# Patient Record
Sex: Male | Born: 1965 | Race: White | Hispanic: No | Marital: Married | Smoking: Former smoker
Health system: Southern US, Community
[De-identification: ages and names within clinical notes are randomized; demographics above are authoritative.]

## PROBLEM LIST (undated history)

## (undated) DIAGNOSIS — R519 Headache, unspecified: Secondary | ICD-10-CM

## (undated) DIAGNOSIS — G935 Compression of brain: Secondary | ICD-10-CM

## (undated) DIAGNOSIS — E781 Pure hyperglyceridemia: Secondary | ICD-10-CM

## (undated) DIAGNOSIS — G4730 Sleep apnea, unspecified: Secondary | ICD-10-CM

## (undated) DIAGNOSIS — N200 Calculus of kidney: Secondary | ICD-10-CM

## (undated) DIAGNOSIS — Z87442 Personal history of urinary calculi: Secondary | ICD-10-CM

## (undated) DIAGNOSIS — R51 Headache: Secondary | ICD-10-CM

## (undated) DIAGNOSIS — K5792 Diverticulitis of intestine, part unspecified, without perforation or abscess without bleeding: Secondary | ICD-10-CM

## (undated) HISTORY — PX: WISDOM TOOTH EXTRACTION: SHX21

## (undated) HISTORY — DX: Compression of brain: G93.5

---

## 2006-11-07 ENCOUNTER — Ambulatory Visit: Payer: Self-pay | Admitting: Gastroenterology

## 2006-11-11 ENCOUNTER — Ambulatory Visit: Payer: Self-pay | Admitting: Gastroenterology

## 2006-11-13 ENCOUNTER — Ambulatory Visit: Payer: Self-pay | Admitting: Gastroenterology

## 2006-11-19 ENCOUNTER — Ambulatory Visit: Payer: Self-pay | Admitting: Gastroenterology

## 2010-02-18 ENCOUNTER — Emergency Department: Payer: Self-pay | Admitting: Internal Medicine

## 2010-03-08 ENCOUNTER — Emergency Department: Payer: Self-pay | Admitting: Emergency Medicine

## 2010-03-08 ENCOUNTER — Ambulatory Visit: Payer: Self-pay | Admitting: Urology

## 2011-05-31 ENCOUNTER — Emergency Department: Payer: Self-pay | Admitting: Emergency Medicine

## 2011-07-04 ENCOUNTER — Ambulatory Visit: Payer: Self-pay | Admitting: Urology

## 2011-08-08 ENCOUNTER — Ambulatory Visit: Payer: Self-pay | Admitting: Urology

## 2011-10-24 ENCOUNTER — Ambulatory Visit: Payer: Self-pay | Admitting: Urology

## 2011-12-04 ENCOUNTER — Ambulatory Visit: Payer: Self-pay | Admitting: Internal Medicine

## 2011-12-27 ENCOUNTER — Ambulatory Visit: Payer: Self-pay | Admitting: Internal Medicine

## 2015-03-09 ENCOUNTER — Ambulatory Visit: Payer: BLUE CROSS/BLUE SHIELD | Attending: Internal Medicine

## 2015-03-09 DIAGNOSIS — G4700 Insomnia, unspecified: Secondary | ICD-10-CM | POA: Diagnosis present

## 2015-03-09 DIAGNOSIS — R0683 Snoring: Secondary | ICD-10-CM | POA: Diagnosis present

## 2015-03-09 DIAGNOSIS — G4733 Obstructive sleep apnea (adult) (pediatric): Secondary | ICD-10-CM | POA: Diagnosis not present

## 2015-07-04 ENCOUNTER — Ambulatory Visit
Admission: RE | Admit: 2015-07-04 | Discharge: 2015-07-04 | Disposition: A | Discharge status: Home / Self Care | Payer: BLUE CROSS/BLUE SHIELD | Source: Ambulatory Visit | Attending: Internal Medicine | Admitting: Internal Medicine

## 2015-07-04 ENCOUNTER — Other Ambulatory Visit: Payer: Self-pay | Admitting: Internal Medicine

## 2015-07-04 DIAGNOSIS — R1031 Right lower quadrant pain: Secondary | ICD-10-CM | POA: Diagnosis present

## 2015-07-04 DIAGNOSIS — N200 Calculus of kidney: Secondary | ICD-10-CM | POA: Insufficient documentation

## 2015-07-04 DIAGNOSIS — K760 Fatty (change of) liver, not elsewhere classified: Secondary | ICD-10-CM | POA: Diagnosis not present

## 2015-07-04 DIAGNOSIS — K5730 Diverticulosis of large intestine without perforation or abscess without bleeding: Secondary | ICD-10-CM | POA: Insufficient documentation

## 2015-07-04 DIAGNOSIS — G8929 Other chronic pain: Secondary | ICD-10-CM | POA: Diagnosis present

## 2015-07-04 DIAGNOSIS — R102 Pelvic and perineal pain: Secondary | ICD-10-CM | POA: Insufficient documentation

## 2015-09-07 ENCOUNTER — Emergency Department
Admission: EM | Admit: 2015-09-07 | Discharge: 2015-09-07 | Disposition: A | Discharge status: Home / Self Care | Payer: BLUE CROSS/BLUE SHIELD | Attending: Emergency Medicine | Admitting: Emergency Medicine

## 2015-09-07 ENCOUNTER — Emergency Department: Payer: BLUE CROSS/BLUE SHIELD

## 2015-09-07 DIAGNOSIS — R1033 Periumbilical pain: Secondary | ICD-10-CM | POA: Diagnosis present

## 2015-09-07 DIAGNOSIS — K5792 Diverticulitis of intestine, part unspecified, without perforation or abscess without bleeding: Secondary | ICD-10-CM | POA: Diagnosis not present

## 2015-09-07 HISTORY — DX: Diverticulitis of intestine, part unspecified, without perforation or abscess without bleeding: K57.92

## 2015-09-07 HISTORY — DX: Calculus of kidney: N20.0

## 2015-09-07 LAB — URINALYSIS COMPLETE WITH MICROSCOPIC (ARMC ONLY)
Bacteria, UA: NONE SEEN
Bilirubin Urine: NEGATIVE
Glucose, UA: NEGATIVE mg/dL
Hgb urine dipstick: NEGATIVE
KETONES UR: NEGATIVE mg/dL
Leukocytes, UA: NEGATIVE
Nitrite: NEGATIVE
PROTEIN: NEGATIVE mg/dL
SQUAMOUS EPITHELIAL / LPF: NONE SEEN
Specific Gravity, Urine: 1.034 — ABNORMAL HIGH (ref 1.005–1.030)
pH: 5 (ref 5.0–8.0)

## 2015-09-07 LAB — CBC
HCT: 42.6 % (ref 40.0–52.0)
HEMOGLOBIN: 14.5 g/dL (ref 13.0–18.0)
MCH: 32.3 pg (ref 26.0–34.0)
MCHC: 34 g/dL (ref 32.0–36.0)
MCV: 94.8 fL (ref 80.0–100.0)
PLATELETS: 199 10*3/uL (ref 150–440)
RBC: 4.49 MIL/uL (ref 4.40–5.90)
RDW: 12.5 % (ref 11.5–14.5)
WBC: 12 10*3/uL — ABNORMAL HIGH (ref 3.8–10.6)

## 2015-09-07 LAB — COMPREHENSIVE METABOLIC PANEL
ALBUMIN: 4.1 g/dL (ref 3.5–5.0)
ALK PHOS: 80 U/L (ref 38–126)
ALT: 75 U/L — AB (ref 17–63)
AST: 38 U/L (ref 15–41)
Anion gap: 5 (ref 5–15)
BUN: 21 mg/dL — ABNORMAL HIGH (ref 6–20)
CALCIUM: 8.9 mg/dL (ref 8.9–10.3)
CO2: 26 mmol/L (ref 22–32)
CREATININE: 0.82 mg/dL (ref 0.61–1.24)
Chloride: 105 mmol/L (ref 101–111)
GFR calc Af Amer: >60 mL/min (ref >60)
GFR calc non Af Amer: >60 mL/min (ref >60)
GLUCOSE: 173 mg/dL — AB (ref 65–99)
Potassium: 3.8 mmol/L (ref 3.5–5.1)
SODIUM: 136 mmol/L (ref 135–145)
Total Bilirubin: 0.8 mg/dL (ref 0.3–1.2)
Total Protein: 7.1 g/dL (ref 6.5–8.1)

## 2015-09-07 LAB — LIPASE, BLOOD: Lipase: 31 U/L (ref 11–51)

## 2015-09-07 MED ORDER — METRONIDAZOLE 500 MG PO TABS: 500.0000 mg | ORAL_TABLET | Freq: Three times a day (TID) | ORAL | Status: AC

## 2015-09-07 MED ORDER — IOHEXOL 240 MG/ML SOLN
25.0000 mL | Freq: Once | INTRAMUSCULAR | Status: AC | PRN
  Administered 2015-09-07: 25 mL via ORAL
  Filled 2015-09-07: qty 25

## 2015-09-07 MED ORDER — CIPROFLOXACIN HCL 500 MG PO TABS: 500.0000 mg | ORAL_TABLET | Freq: Two times a day (BID) | ORAL | Status: AC

## 2015-09-07 MED ORDER — IOHEXOL 300 MG/ML  SOLN
100.0000 mL | Freq: Once | INTRAMUSCULAR | Status: AC | PRN
  Administered 2015-09-07: 100 mL via INTRAVENOUS
  Filled 2015-09-07: qty 100

## 2015-09-07 MED ORDER — ONDANSETRON HCL 4 MG PO TABS: ORAL_TABLET | ORAL | Status: DC

## 2015-09-07 MED ORDER — DOCUSATE SODIUM 100 MG PO CAPS: ORAL_CAPSULE | ORAL | Status: DC

## 2015-09-07 MED ORDER — HYDROCODONE-ACETAMINOPHEN 5-325 MG PO TABS: 1.0000 | ORAL_TABLET | ORAL | Status: DC | PRN

## 2015-09-07 NOTE — ED Notes (Signed)
MD at bedside. 

## 2015-09-07 NOTE — ED Notes (Signed)
Pt alert and oriented X4, active, cooperative, pt in NAD. RR even and unlabored, color WNL.  Pt informed to return if any life threatening symptoms occur.   

## 2015-09-07 NOTE — ED Notes (Signed)
Patient transported to CT 

## 2015-09-07 NOTE — ED Provider Notes (Signed)
[facility] Emergency Department Provider Note  ____________________________________________  Time seen: Approximately 4:01 PM  I have reviewed the triage vital signs and the nursing notes.   HISTORY  Chief Complaint Abdominal Pain    HPI Maxwell Smith is a 49 y.o. male who presents with left-sided periumbilical abdominal pain that started this morning and his been gradually getting worse over the course of the day.  He has had some nausea but no vomiting.  He states that he has been having similar pain over the last several months and has been "bouncing back and forth" between his primary care doctor and his urologist.  He had a noncontrast CT scan about 2 months ago that showed diverticulosis and nephrolithiasis but without any obstructive stones.  Though he has had intermittent pain for several months, the pain is more localized to the left lower quadrant today and has been more persistent.  He rates the pain as severe with movement but mild at rest.  He denies fever/chills, chest pain, shortness of breath.  He also denies dysuria, testicular pain, and hematuria at this time.   Past Medical History  Diagnosis Date  . Kidney stone   . Diverticulitis     There are no active problems to display for this patient.   History reviewed. No pertinent past surgical history.  Current Outpatient Rx  Name  Route  Sig  Dispense  Refill  . ciprofloxacin (CIPRO) 500 MG tablet   Oral   Take 1 tablet (500 mg total) by mouth 2 (two) times daily.   14 tablet   0   . docusate sodium (COLACE) 100 MG capsule      Take 1 tablet once or twice daily as needed for constipation while taking narcotic pain medicine   30 capsule   0   . HYDROcodone-acetaminophen (NORCO/VICODIN) 5-325 MG tablet   Oral   Take 1-2 tablets by mouth every 4 (four) hours as needed for moderate pain.   15 tablet   0   . metroNIDAZOLE (FLAGYL) 500 MG tablet   Oral   Take 1 tablet (500 mg  total) by mouth 3 (three) times daily.   21 tablet   0   . ondansetron (ZOFRAN) 4 MG tablet      Take 1-2 tabs by mouth every 8 hours as needed for nausea/vomiting   30 tablet   0     Allergies Tricor  No family history on file.  Social History Social History  Substance Use Topics  . Smoking status: Never Smoker   . Smokeless tobacco: None  . Alcohol Use: Yes    Review of Systems Constitutional: No fever/chills Eyes: No visual changes. ENT: No sore throat. Cardiovascular: Denies chest pain. Respiratory: Denies shortness of breath. Gastrointestinal: LLQ abd pain w/ nausea Genitourinary: Negative for dysuria. Musculoskeletal: Negative for back pain. Skin: Negative for rash. Neurological: Negative for headaches, focal weakness or numbness.  10-point ROS otherwise negative.  ____________________________________________   PHYSICAL EXAM:  VITAL SIGNS: ED Triage Vitals  Enc Vitals Group     BP 09/07/15 1502 155/85 mmHg     Pulse Rate 09/07/15 1502 70     Resp 09/07/15 1502 20     Temp 09/07/15 1502 97.6 F (36.4 C)     Temp Source 09/07/15 1502 Oral     SpO2 09/07/15 1502 98 %     Weight 09/07/15 1502 232 lb (105.235 kg)     Height 09/07/15 1502  (1.803 m)  Head Cir --      Peak Flow --      Pain Score 09/07/15 1507 4     Pain Loc --      Pain Edu? --      Excl. in GC? --     Constitutional: Alert and oriented. Well appearing and in no acute distress but appears uncomfortable Eyes: Conjunctivae are normal. PERRL. EOMI. Head: Atraumatic. Nose: No congestion/rhinnorhea. Mouth/Throat: Mucous membranes are moist.  Oropharynx non-erythematous. Neck: No stridor.   Cardiovascular: Normal rate, regular rhythm. Grossly normal heart sounds.  Good peripheral circulation. Respiratory: Normal respiratory effort.  No retractions. Lungs CTAB. Gastrointestinal: Soft w/ TTP of LLQ and suprapubic region.  No peritonitis.  No abd bruits or pulsatile  masses Musculoskeletal: No lower extremity tenderness nor edema.  No joint effusions. Neurologic:  Normal speech and language. No gross focal neurologic deficits are appreciated.  Skin:  Skin is warm, dry and intact. No rash noted. Psychiatric: Mood and affect are normal. Speech and behavior are normal.  ____________________________________________   LABS (all labs ordered are listed, but only abnormal results are displayed)  Labs Reviewed  COMPREHENSIVE METABOLIC PANEL - Abnormal; Notable for the following:    Glucose, Bld 173 (*)    BUN 21 (*)    ALT 75 (*)    All other components within normal limits  CBC - Abnormal; Notable for the following:    WBC 12.0 (*)    All other components within normal limits  URINALYSIS COMPLETEWITH MICROSCOPIC ([facility] ONLY) - Abnormal; Notable for the following:    Color, Urine YELLOW (*)    APPearance CLEAR (*)    Specific Gravity, Urine 1.034 (*)    All other components within normal limits  LIPASE, BLOOD   ____________________________________________  EKG  ED ECG REPORT I, Tashon Capp, the attending physician, personally viewed and interpreted this ECG.  Date: 09/07/2015 EKG Time: 15:08 Rate: 56 Rhythm: Sinus bradycardia QRS Axis: normal Intervals: normal ST/T Wave abnormalities: normal Conduction Disutrbances: none Narrative Interpretation: unremarkable  ____________________________________________  RADIOLOGY   Ct Abdomen Pelvis W Contrast  09/07/2015  CLINICAL DATA:  Left-sided periumbilical pain, onset this morning, progressive worsening. Nausea. Pain radiates to the back. EXAM: CT ABDOMEN AND PELVIS WITH CONTRAST TECHNIQUE: Multidetector CT imaging of the abdomen and pelvis was performed using the standard protocol following bolus administration of intravenous contrast. CONTRAST:  100mL OMNIPAQUE IOHEXOL 300 MG/ML  SOLN COMPARISON:  CT 07/04/2015 FINDINGS: Lower chest: Minimal linear scarring in the left lower lobe. No  consolidation. Heart is normal in size. Liver: Diffusely decreased density consistent with steatosis. Focal fatty sparing is seen in the region the porta hepatis and gallbladder fossa. There is a subcentimeter hypodensity within the right lobe that is unchanged from prior exam. Hepatobiliary: Gallbladder physiologically distended, no calcified stone. No biliary dilatation. Pancreas: Normal. Spleen: Normal in size and density.  Small cleft posteriorly. Adrenal glands: No nodule. Kidneys: Symmetric renal enhancement and excretion. Bilateral nonobstructing nephrolithiasis, with largest stone in the lower left kidney measuring 6 mm. No hydronephrosis. No perinephric stranding. Ureters are decompressed without stones along the course. 2.8 cm simple cyst in the anterior lower left kidney with additional smaller cortical cysts, unchanged. Stomach/Bowel: Stomach is decompressed. There are no dilated or thickened small bowel loops. There is distal colonic diverticulosis with tortuosity of sigmoid colon. Equivocal wall thickening and mild adjacent fat stranding involving the sigmoid colon in the mid pelvis, may reflect minimal acute diverticulitis. No perforation, abscess or free air.  The appendix is normal. Vascular/Lymphatic: No retroperitoneal adenopathy. Abdominal aorta is normal in caliber. Mild atherosclerosis of the abdominal aorta without aneurysm. Reproductive: Prostate gland normal in size. Bladder: Physiologically distended, no wall thickening. Other: No free air, free fluid, or intra-abdominal fluid collection. Fat within the left inguinal canal. Musculoskeletal: There are no acute or suspicious osseous abnormalities. Degenerative disc disease at L5-S1. IMPRESSION: 1. Mild acute uncomplicated diverticulitis in the sigmoid colon, no perforation or abscess. 2. Chronic incidental findings include bilateral nephrolithiasis and hepatic steatosis. Electronically Signed   By: Rubye Oaks M.D.   On: 09/07/2015 18:19     ____________________________________________   PROCEDURES  Procedure(s) performed: None  Critical Care performed: No ____________________________________________   INITIAL IMPRESSION / ASSESSMENT AND PLAN / ED COURSE  Pertinent labs & imaging results that were available during my care of the patient were reviewed by me and considered in my medical decision making (see chart for details).  The patient did have a noncontrast CT scan 2 months ago, the acute onset of his discomfort today may represent a diverticulitis.  Alternatively, he may have passed or been in the process of passing a small stone from the larger nephrolithiasis identified previously.  Given his discomfort and tenderness I will evaluate with a contrasted CT scan.  He states at this time he does not want any pain medicine, but I offered to give his pain gets worse that we will treat it.  Aortic pathology is possible but less likely, and we will be able to see the caliber of his aorta on the CT scan as well.  ----------------------------------------- 6:49 PM on 09/07/2015 -----------------------------------------  Uncomplicated diverticulitis on CT.  I had an extensive discussion with the patient and his wife about this diagnosis in my usual customary recommendations, precautions, and follow-up recommendations.  The patient understands and agrees.  ____________________________________________  FINAL CLINICAL IMPRESSION(S) / ED DIAGNOSES  Final diagnoses:  Diverticulitis of intestine without perforation or abscess without bleeding      NEW MEDICATIONS STARTED DURING THIS VISIT:  New Prescriptions   CIPROFLOXACIN (CIPRO) 500 MG TABLET    Take 1 tablet (500 mg total) by mouth 2 (two) times daily.   DOCUSATE SODIUM (COLACE) 100 MG CAPSULE    Take 1 tablet once or twice daily as needed for constipation while taking narcotic pain medicine   HYDROCODONE-ACETAMINOPHEN (NORCO/VICODIN) 5-325 MG TABLET    Take 1-2  tablets by mouth every 4 (four) hours as needed for moderate pain.   METRONIDAZOLE (FLAGYL) 500 MG TABLET    Take 1 tablet (500 mg total) by mouth 3 (three) times daily.   ONDANSETRON (ZOFRAN) 4 MG TABLET    Take 1-2 tabs by mouth every 8 hours as needed for nausea/vomiting     Loleta Rose, MD 09/07/15 1850

## 2015-09-07 NOTE — ED Notes (Signed)
Ct staff at bedside to instruct pt on CT contrast

## 2015-09-07 NOTE — Discharge Instructions (Signed)
We believe your symptoms are caused by diverticulitis.  Most of the time this condition (please read through the included information) can be cured with outpatient antibiotics.  Please take the full course of prescribed medication(s) and follow up with the doctors recommended above.  Return to the ED if your abdominal pain worsens or fails to improve, you develop bloody vomiting, bloody diarrhea, you are unable to tolerate fluids due to vomiting, fever greater than 101, or other symptoms that concern you.  Take Norco as prescribed for severe pain. Do not drink alcohol, drive or participate in any other potentially dangerous activities while taking this medication as it may make you sleepy. Do not take this medication with any other sedating medications, either prescription or over-the-counter. If you were prescribed Percocet or Vicodin, do not take these with acetaminophen (Tylenol) as it is already contained within these medications.   This medication is an opiate (or narcotic) pain medication and can be habit forming.  Use it as little as possible to achieve adequate pain control.  Do not use or use it with extreme caution if you have a history of opiate abuse or dependence.  If you are on a pain contract with your primary care doctor or a pain specialist, be sure to let them know you were prescribed this medication today from the [facility] Emergency Department.  This medication is intended for your use only - do not give any to anyone else and keep it in a secure place where nobody else, especially children, have access to it.  It will also cause or worsen constipation, so you may want to consider taking an over-the-counter stool softener while you are taking this medication.   Diverticulitis Diverticulitis is inflammation or infection of small pouches in your colon that form when you have a condition called diverticulosis. The pouches in your colon are called diverticula. Your colon, or large  intestine, is where water is absorbed and stool is formed. Complications of diverticulitis can include:  Bleeding.  Severe infection.  Severe pain.  Perforation of your colon.  Obstruction of your colon. CAUSES  Diverticulitis is caused by bacteria. Diverticulitis happens when stool becomes trapped in diverticula. This allows bacteria to grow in the diverticula, which can lead to inflammation and infection. RISK FACTORS People with diverticulosis are at risk for diverticulitis. Eating a diet that does not include enough fiber from fruits and vegetables may make diverticulitis more likely to develop. SYMPTOMS  Symptoms of diverticulitis may include:  Abdominal pain and tenderness. The pain is normally located on the left side of the abdomen, but may occur in other areas.  Fever and chills.  Bloating.  Cramping.  Nausea.  Vomiting.  Constipation.  Diarrhea.  Blood in your stool. DIAGNOSIS  Your health care provider will ask you about your medical history and do a physical exam. You may need to have tests done because many medical conditions can cause the same symptoms as diverticulitis. Tests may include:  Blood tests.  Urine tests.  Imaging tests of the abdomen, including X-rays and CT scans. When your condition is under control, your health care provider may recommend that you have a colonoscopy. A colonoscopy can show how severe your diverticula are and whether something else is causing your symptoms. TREATMENT  Most cases of diverticulitis are mild and can be treated at home. Treatment may include:  Taking over-the-counter pain medicines.  Following a clear liquid diet.  Taking antibiotic medicines by mouth for 7-10 days. More severe  cases may be treated at a hospital. Treatment may include: °· Not eating or drinking. °· Taking prescription pain medicine. °· Receiving antibiotic medicines through an IV tube. °· Receiving fluids and nutrition through an IV  tube. °· Surgery. °HOME CARE INSTRUCTIONS  °· Follow your health care provider's instructions carefully. °· Follow a full liquid diet or other diet as directed by your health care provider. After your symptoms improve, your health care provider may tell you to change your diet. He or she may recommend you eat a high-fiber diet. Fruits and vegetables are good sources of fiber. Fiber makes it easier to pass stool. °· Take fiber supplements or probiotics as directed by your health care provider. °· Only take medicines as directed by your health care provider. °· Keep all your follow-up appointments. °SEEK MEDICAL CARE IF:  °· Your pain does not improve. °· You have a hard time eating food. °· Your bowel movements do not return to normal. °SEEK IMMEDIATE MEDICAL CARE IF:  °· Your pain becomes worse. °· Your symptoms do not get better. °· Your symptoms suddenly get worse. °· You have a fever. °· You have repeated vomiting. °· You have bloody or black, tarry stools. °MAKE SURE YOU:  °· Understand these instructions. °· Will watch your condition. °· Will get help right away if you are not doing well or get worse. °Document Released: 07/24/2005 Document Revised: 10/19/2013 Document Reviewed: 09/08/2013 °[employer]® Patient Information ©2015 [employer]. This information is not intended to replace advice given to you by your health care provider. Make sure you discuss any questions you have with your health care provider. ° °

## 2015-09-07 NOTE — ED Notes (Signed)
Left sided umbilical level pain that began this AM, worsening approx 12PM today. Nausea. Pain radiates to lower back. Denies urinary sx. Pt alert and oriented X4, active, cooperative, pt in NAD. RR even and unlabored, color WNL.

## 2015-11-17 ENCOUNTER — Encounter: Payer: Self-pay | Admitting: *Deleted

## 2015-11-20 ENCOUNTER — Ambulatory Visit: Payer: BLUE CROSS/BLUE SHIELD

## 2015-11-20 ENCOUNTER — Ambulatory Visit: Payer: BLUE CROSS/BLUE SHIELD | Admitting: Anesthesiology

## 2015-11-20 ENCOUNTER — Ambulatory Visit
Admission: RE | Admit: 2015-11-20 | Discharge: 2015-11-20 | Disposition: A | Discharge status: Home / Self Care | Payer: BLUE CROSS/BLUE SHIELD | Source: Ambulatory Visit | Attending: Gastroenterology | Admitting: Gastroenterology

## 2015-11-20 ENCOUNTER — Encounter: Payer: Self-pay | Admitting: Anesthesiology

## 2015-11-20 ENCOUNTER — Encounter
Admission: RE | Disposition: A | Discharge status: Home / Self Care | Payer: Self-pay | Source: Ambulatory Visit | Attending: Gastroenterology

## 2015-11-20 DIAGNOSIS — K921 Melena: Secondary | ICD-10-CM | POA: Diagnosis not present

## 2015-11-20 DIAGNOSIS — K2210 Ulcer of esophagus without bleeding: Secondary | ICD-10-CM | POA: Diagnosis not present

## 2015-11-20 DIAGNOSIS — K5730 Diverticulosis of large intestine without perforation or abscess without bleeding: Secondary | ICD-10-CM | POA: Diagnosis not present

## 2015-11-20 DIAGNOSIS — R1013 Epigastric pain: Secondary | ICD-10-CM | POA: Diagnosis not present

## 2015-11-20 DIAGNOSIS — Z79899 Other long term (current) drug therapy: Secondary | ICD-10-CM | POA: Insufficient documentation

## 2015-11-20 DIAGNOSIS — K210 Gastro-esophageal reflux disease with esophagitis: Secondary | ICD-10-CM | POA: Diagnosis not present

## 2015-11-20 DIAGNOSIS — Q438 Other specified congenital malformations of intestine: Secondary | ICD-10-CM | POA: Diagnosis not present

## 2015-11-20 DIAGNOSIS — K2950 Unspecified chronic gastritis without bleeding: Secondary | ICD-10-CM | POA: Insufficient documentation

## 2015-11-20 DIAGNOSIS — E781 Pure hyperglyceridemia: Secondary | ICD-10-CM | POA: Insufficient documentation

## 2015-11-20 DIAGNOSIS — Z7951 Long term (current) use of inhaled steroids: Secondary | ICD-10-CM | POA: Diagnosis not present

## 2015-11-20 DIAGNOSIS — Z888 Allergy status to other drugs, medicaments and biological substances status: Secondary | ICD-10-CM | POA: Diagnosis not present

## 2015-11-20 DIAGNOSIS — G4730 Sleep apnea, unspecified: Secondary | ICD-10-CM | POA: Insufficient documentation

## 2015-11-20 DIAGNOSIS — R1032 Left lower quadrant pain: Secondary | ICD-10-CM | POA: Insufficient documentation

## 2015-11-20 DIAGNOSIS — Z9889 Other specified postprocedural states: Secondary | ICD-10-CM

## 2015-11-20 HISTORY — DX: Headache: R51

## 2015-11-20 HISTORY — PX: COLONOSCOPY WITH PROPOFOL: SHX5780

## 2015-11-20 HISTORY — DX: Headache, unspecified: R51.9

## 2015-11-20 HISTORY — DX: Sleep apnea, unspecified: G47.30

## 2015-11-20 HISTORY — DX: Pure hyperglyceridemia: E78.1

## 2015-11-20 HISTORY — PX: ESOPHAGOGASTRODUODENOSCOPY (EGD) WITH PROPOFOL: SHX5813

## 2015-11-20 SURGERY — COLONOSCOPY WITH PROPOFOL
Anesthesia: General

## 2015-11-20 MED ORDER — FENTANYL CITRATE (PF) 100 MCG/2ML IJ SOLN
INTRAMUSCULAR | Status: DC | PRN
  Administered 2015-11-20 (×2): 50 ug via INTRAVENOUS

## 2015-11-20 MED ORDER — LIDOCAINE HCL (PF) 1 % IJ SOLN
INTRAMUSCULAR | Status: AC
  Administered 2015-11-20: 0.3 mL via INTRADERMAL
  Filled 2015-11-20: qty 2

## 2015-11-20 MED ORDER — PROPOFOL 500 MG/50ML IV EMUL
INTRAVENOUS | Status: DC | PRN
  Administered 2015-11-20: 180 ug/kg/min via INTRAVENOUS

## 2015-11-20 MED ORDER — MIDAZOLAM HCL 5 MG/5ML IJ SOLN
INTRAMUSCULAR | Status: DC | PRN
  Administered 2015-11-20: 2 mg via INTRAVENOUS

## 2015-11-20 MED ORDER — LIDOCAINE HCL (PF) 1 % IJ SOLN
2.0000 mL | Freq: Once | INTRAMUSCULAR | Status: AC
  Administered 2015-11-20: 0.3 mL via INTRADERMAL

## 2015-11-20 MED ORDER — SODIUM CHLORIDE 0.9 % IV SOLN
INTRAVENOUS | Status: DC
  Administered 2015-11-20: 1000 mL via INTRAVENOUS

## 2015-11-20 MED ORDER — PROPOFOL 10 MG/ML IV BOLUS
INTRAVENOUS | Status: DC | PRN
  Administered 2015-11-20: 20 mg via INTRAVENOUS
  Administered 2015-11-20: 50 mg via INTRAVENOUS
  Administered 2015-11-20: 20 mg via INTRAVENOUS

## 2015-11-20 NOTE — H&P (Signed)
Outpatient short stay form Pre-procedure 11/20/2015 2:25 PM Maxwell Deem MD  Primary Physician: Dr. Einar Crow  Reason for visit:  EGD and colonoscopy  History of present illness:  Patient is a 50 year old male presenting today the recent history of diverticulitis. He has a known history of diverticulosis. He did go to the emergency room and had a CT scan done on 09/07/2015 showing some active diverticulitis. He was treated with 2 weeks of antibiotics and has not had any repeat symptoms of that nature. However he also states he had seen some black stools in the past couple of months as well as having problems with dyspepsia. There has been some continued left lower quadrant discomfort though not like It was and some occasional right upper quadrant discomfort.  He tolerated his prep well. He takes no aspirin or blood thinning products.      Current facility-administered medications:  .  0.9 %  sodium chloride infusion, , Intravenous, Continuous, Maxwell Deem, MD, Last Rate: 20 mL/hr at 11/20/15 1331, 1,000 mL at 11/20/15 1331  Prescriptions prior to admission  Medication Sig Dispense Refill Last Dose  . fluticasone (FLONASE) 50 MCG/ACT nasal spray Place 2 sprays into both nostrils daily.     Marland Kitchen ibuprofen (ADVIL,MOTRIN) 100 MG tablet Take 100 mg by mouth every 6 (six) hours as needed for fever.     . traMADol (ULTRAM) 50 MG tablet Take by mouth every 6 (six) hours as needed.     . docusate sodium (COLACE) 100 MG capsule Take 1 tablet once or twice daily as needed for constipation while taking narcotic pain medicine 30 capsule 0   . HYDROcodone-acetaminophen (NORCO/VICODIN) 5-325 MG tablet Take 1-2 tablets by mouth every 4 (four) hours as needed for moderate pain. 15 tablet 0   . ondansetron (ZOFRAN) 4 MG tablet Take 1-2 tabs by mouth every 8 hours as needed for nausea/vomiting 30 tablet 0      Allergies  Allergen Reactions  . Tricor [Fenofibrate]      Past Medical  History  Diagnosis Date  . Kidney stone   . Diverticulitis   . Headache   . Sleep apnea   . Hypertriglyceridemia     Review of systems:      Physical Exam    Heart and lungs: Regular rate and rhythm without rub or gallop, lungs are bilaterally clear.    HEENT: Normocephalic atraumatic eyes are anicteric    Other:     Pertinant exam for procedure: Soft, protuberant, mild discomfort palpation left lower quadrant. No masses rebound or organomegaly. Bowel sounds positive normoactive.    Planned proceedures: EGD and colonoscopy with indicated procedures. I have discussed the risks benefits and complications of procedures to include not limited to bleeding, infection, perforation and the risk of sedation and the patient wishes to proceed.    Maxwell Deem, MD Gastroenterology 11/20/2015  2:25 PM

## 2015-11-20 NOTE — Op Note (Signed)
[facility] Gastroenterology Patient Name: Maxwell Smith Procedure Date: 11/20/2015 2:26 PM MRN: [id number] Account #: [id number] Date of Birth: 12-14-1965 Admit Type: Outpatient Age: 50 Room: [facility] ENDO ROOM 3 Gender: Male Note Status: Finalized Procedure:         Colonoscopy Indications:       Abdominal pain in the left lower quadrant, Follow-up of                     diverticulitis Providers:         [name], MD Referring MD:      Marya Amsler. Dareen Piano, MD (Referring MD) Medicines:         Monitored Anesthesia Care Complications:     On retrieval of the scope an area of ecchymosis was noted                     in the wall of thte colon, not circumferential, at about                     45-55 cm from the anal verge. Inspection of this showed no                     evidence of mucosal defect or free blood in the lumen,                     though difficult to visualize due to angulation and I did                     not want to put pressure on the area. This was likely from                     movement of the scope. Procedure:         Pre-Anesthesia Assessment:                    - ASA Grade Assessment: II - A patient with mild systemic                     disease.                    After obtaining informed consent, the colonoscope was                     passed under direct vision. Throughout the procedure, the                     patient's blood pressure, pulse, and oxygen saturations                     were monitored continuously. The Olympus PCF-H180AL                     colonoscope ( S#: [id number] ) was introduced through the                     anus and advanced to the the cecum, identified by                     appendiceal orifice and ileocecal valve. The colonoscopy                     was unusually difficult due to significant looping and a  tortuous colon. Successful completion of the procedure was   aided by changing the patient to a supine position,                     changing the patient to a prone position, using manual                     pressure and withdrawing and reinserting the scope. The                     patient tolerated the procedure well. The quality of the                     bowel preparation was good. Findings:      Multiple small and large-mouthed diverticula were found in the sigmoid       colon, in the descending colon and in the distal transverse colon.      The sigmoid colon, descending colon, splenic flexure and transverse       colon were moderately tortuous.      The retroflexed view of the distal rectum and anal verge was normal and       showed no anal or rectal abnormalities. Impression:        - Diverticulosis in the sigmoid colon, in the descending                     colon and in the distal transverse colon.                    - Tortuous colon.                    - The distal rectum and anal verge are normal on                     retroflexion view.                    - No specimens collected. Recommendation:    - Observe patient in GI recovery unit for observation.                    - Perform a flat plate and upright abdominal x-ray today.                    - Clear liquid diet today.                    - Full liquid diet for 3 days.                    - Low fiber diet for 5 days.                    - Return to GI clinic in 2 weeks. Procedure Code(s): --- Professional ---                    [phone], Colonoscopy, flexible; diagnostic, including                     collection of specimen(s) by brushing or washing, when                     performed (separate procedure) Diagnosis Code(s): --- Professional ---  R10.32, Left lower quadrant pain                    K57.32, Diverticulitis of large intestine without                     perforation or abscess without bleeding                    K57.30, Diverticulosis of large intestine  without                     perforation or abscess without bleeding                    Q43.8, Other specified congenital malformations of                     intestine CPT copyright 2014 [employer]. All rights reserved. The codes documented in this report are preliminary and upon coder review may  be revised to meet current compliance requirements. [name], MD 11/20/2015 4:04:45 PM This report has been signed electronically. Number of Addenda: 0 Note Initiated On: 11/20/2015 2:26 PM Scope Withdrawal Time: 0 hours 12 minutes 55 seconds  Total Procedure Duration: 0 hours 52 minutes 28 seconds       [facility]

## 2015-11-20 NOTE — Anesthesia Procedure Notes (Signed)
Date/Time: 11/20/2015 2:45 PM Performed by: Henrietta Hoover Pre-anesthesia Checklist: Patient identified, Emergency Drugs available, Suction available, Patient being monitored and Timeout performed Patient Re-evaluated:Patient Re-evaluated prior to inductionOxygen Delivery Method: Nasal cannula Preoxygenation: Pre-oxygenation with 100% oxygen Intubation Type: IV induction

## 2015-11-20 NOTE — Op Note (Signed)
[facility] Gastroenterology Patient Name: Maxwell Smith Procedure Date: 11/20/2015 2:26 PM MRN: [id number] Account #: [id number] Date of Birth: 09-02-1966 Admit Type: Outpatient Age: 50 Room: [facility] ENDO ROOM 3 Gender: Male Note Status: Finalized Procedure:         Upper GI endoscopy Indications:       Abdominal pain in the right upper quadrant, Abdominal pain                     in the left lower quadrant, Generalized abdominal pain,                     Dyspepsia, Melena Providers:         [name], MD Referring MD:      Marya Amsler. Dareen Piano, MD (Referring MD) Medicines:         Monitored Anesthesia Care Complications:     No immediate complications. Procedure:         Pre-Anesthesia Assessment:                    - ASA Grade Assessment: II - A patient with mild systemic                     disease.                    After obtaining informed consent, the endoscope was passed                     under direct vision. Throughout the procedure, the                     patient's blood pressure, pulse, and oxygen saturations                     were monitored continuously. The Endoscope was introduced                     through the mouth, and advanced to the third part of                     duodenum. The upper GI endoscopy was accomplished without                     difficulty. The patient tolerated the procedure well. Findings:      LA Grade D (one or more mucosal breaks involving at least 75% of       esophageal circumference) esophagitis with no bleeding was found.       Biopsies were taken with a cold forceps for histology.      Diffuse mild inflammation characterized by congestion (edema) and       erythema was found in the gastric body. Biopsies were taken with a cold       forceps for histology.      Patchy mild inflammation characterized by congestion (edema) and       erythema was found in the gastric antrum. Biopsies were taken with a   cold forceps for histology.      The cardia and gastric fundus were normal on retroflexion.      Patchy mild inflammation characterized by congestion (edema) and       erythema was found in the duodenal bulb. Impression:        - LA Grade D erosive esophagitis. Biopsied.                    -  Gastritis. Biopsied.                    - Gastritis. Biopsied.                    - Duodenitis. Recommendation:    - Use Protonix (pantoprazole) 40 mg PO BID for 3 weeks.                    - Use Protonix (pantoprazole) 40 mg PO daily.                    - Await pathology results.                    - Return to GI clinic in 1 month. Procedure Code(s): --- Professional ---                    [phone], Esophagogastroduodenoscopy, flexible, transoral;                     with biopsy, single or multiple Diagnosis Code(s): --- Professional ---                    K20.8, Other esophagitis                    K29.70, Gastritis, unspecified, without bleeding                    K29.80, Duodenitis without bleeding                    R10.11, Right upper quadrant pain                    R10.32, Left lower quadrant pain                    R10.84, Generalized abdominal pain                    K30, Functional dyspepsia                    K92.1, Melena CPT copyright 2014 [employer] Association. All rights reserved. The codes documented in this report are preliminary and upon coder review may  be revised to meet current compliance requirements. [name], MD 11/20/2015 2:52:20 PM This report has been signed electronically. Number of Addenda: 0 Note Initiated On: 11/20/2015 2:26 PM      [facility]

## 2015-11-20 NOTE — Transfer of Care (Signed)
Immediate Anesthesia Transfer of Care Note  Patient: Maxwell Smith  Procedure(s) Performed: Procedure(s): COLONOSCOPY WITH PROPOFOL (N/A) ESOPHAGOGASTRODUODENOSCOPY (EGD) WITH PROPOFOL (N/A)  Patient Location: PACU  Anesthesia Type:General  Level of Consciousness: sedated  Airway & Oxygen Therapy: Patient Spontanous Breathing and Patient connected to face mask oxygen  Post-op Assessment: Report given to RN and Post -op Vital signs reviewed and stable  Post vital signs: Reviewed and stable  Last Vitals:  Filed Vitals:   11/20/15 1316 11/20/15 1600  BP: 125/81 129/68  Pulse: 87 83  Temp: 35.8 C 36 C  Resp: 16 15    Complications: No apparent anesthesia complications
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
## 2015-11-22 ENCOUNTER — Encounter: Payer: Self-pay | Admitting: Gastroenterology

## 2015-11-22 LAB — SURGICAL PATHOLOGY

## 2015-11-22 NOTE — Anesthesia Postprocedure Evaluation (Signed)
Anesthesia Post Note  Patient: Maxwell Smith  Procedure(s) Performed: Procedure(s) (LRB): COLONOSCOPY WITH PROPOFOL (N/A) ESOPHAGOGASTRODUODENOSCOPY (EGD) WITH PROPOFOL (N/A)  Patient location during evaluation: PACU Anesthesia Type: General Level of consciousness: awake and alert and oriented Pain management: pain level controlled Vital Signs Assessment: post-procedure vital signs reviewed and stable Respiratory status: spontaneous breathing Cardiovascular status: blood pressure returned to baseline Anesthetic complications: no    Last Vitals:  Filed Vitals:   11/20/15 1620 11/20/15 1630  BP: 131/88 110/77  Pulse: 70 67  Temp:    Resp: 20 12    Last Pain: There were no vitals filed for this visit.               [name]

## 2015-12-08 ENCOUNTER — Other Ambulatory Visit: Payer: Self-pay | Admitting: Orthopedic Surgery

## 2015-12-08 DIAGNOSIS — M5134 Other intervertebral disc degeneration, thoracic region: Secondary | ICD-10-CM

## 2015-12-08 DIAGNOSIS — M5414 Radiculopathy, thoracic region: Secondary | ICD-10-CM

## 2015-12-08 DIAGNOSIS — M546 Pain in thoracic spine: Secondary | ICD-10-CM

## 2015-12-27 ENCOUNTER — Other Ambulatory Visit: Payer: BLUE CROSS/BLUE SHIELD

## 2015-12-28 ENCOUNTER — Ambulatory Visit: Payer: BLUE CROSS/BLUE SHIELD

## 2018-01-26 DIAGNOSIS — G4733 Obstructive sleep apnea (adult) (pediatric): Secondary | ICD-10-CM | POA: Diagnosis not present

## 2018-02-19 ENCOUNTER — Emergency Department (HOSPITAL_COMMUNITY): Payer: 59

## 2018-02-19 ENCOUNTER — Emergency Department (HOSPITAL_COMMUNITY)
Admission: EM | Admit: 2018-02-19 | Discharge: 2018-02-19 | Disposition: A | Discharge status: Home / Self Care | Payer: 59 | Attending: Emergency Medicine | Admitting: Emergency Medicine

## 2018-02-19 ENCOUNTER — Encounter (HOSPITAL_COMMUNITY): Payer: Self-pay

## 2018-02-19 DIAGNOSIS — M5489 Other dorsalgia: Secondary | ICD-10-CM | POA: Diagnosis not present

## 2018-02-19 DIAGNOSIS — S060X9A Concussion with loss of consciousness of unspecified duration, initial encounter: Secondary | ICD-10-CM

## 2018-02-19 DIAGNOSIS — Y92410 Unspecified street and highway as the place of occurrence of the external cause: Secondary | ICD-10-CM | POA: Insufficient documentation

## 2018-02-19 DIAGNOSIS — S199XXA Unspecified injury of neck, initial encounter: Secondary | ICD-10-CM | POA: Diagnosis not present

## 2018-02-19 DIAGNOSIS — Y999 Unspecified external cause status: Secondary | ICD-10-CM | POA: Insufficient documentation

## 2018-02-19 DIAGNOSIS — Y9389 Activity, other specified: Secondary | ICD-10-CM | POA: Insufficient documentation

## 2018-02-19 DIAGNOSIS — R1031 Right lower quadrant pain: Secondary | ICD-10-CM | POA: Diagnosis not present

## 2018-02-19 DIAGNOSIS — S3991XA Unspecified injury of abdomen, initial encounter: Secondary | ICD-10-CM | POA: Diagnosis not present

## 2018-02-19 DIAGNOSIS — S161XXA Strain of muscle, fascia and tendon at neck level, initial encounter: Secondary | ICD-10-CM | POA: Insufficient documentation

## 2018-02-19 DIAGNOSIS — R079 Chest pain, unspecified: Secondary | ICD-10-CM | POA: Diagnosis not present

## 2018-02-19 DIAGNOSIS — S060XAA Concussion with loss of consciousness status unknown, initial encounter: Secondary | ICD-10-CM

## 2018-02-19 DIAGNOSIS — M25551 Pain in right hip: Secondary | ICD-10-CM | POA: Diagnosis not present

## 2018-02-19 DIAGNOSIS — S50811A Abrasion of right forearm, initial encounter: Secondary | ICD-10-CM | POA: Diagnosis not present

## 2018-02-19 DIAGNOSIS — T148XXA Other injury of unspecified body region, initial encounter: Secondary | ICD-10-CM | POA: Diagnosis not present

## 2018-02-19 DIAGNOSIS — S299XXA Unspecified injury of thorax, initial encounter: Secondary | ICD-10-CM | POA: Diagnosis not present

## 2018-02-19 HISTORY — DX: Concussion with loss of consciousness status unknown, initial encounter: S06.0XAA

## 2018-02-19 HISTORY — DX: Concussion with loss of consciousness of unspecified duration, initial encounter: S06.0X9A

## 2018-02-19 LAB — I-STAT CHEM 8, ED
BUN: 15 mg/dL (ref 6–20)
CALCIUM ION: 1.14 mmol/L — AB (ref 1.15–1.40)
CHLORIDE: 104 mmol/L (ref 101–111)
Creatinine, Ser: 0.9 mg/dL (ref 0.61–1.24)
GLUCOSE: 101 mg/dL — AB (ref 65–99)
HCT: 44 % (ref 39.0–52.0)
HEMOGLOBIN: 15 g/dL (ref 13.0–17.0)
Potassium: 3.6 mmol/L (ref 3.5–5.1)
SODIUM: 140 mmol/L (ref 135–145)
TCO2: 26 mmol/L (ref 22–32)

## 2018-02-19 MED ORDER — IOPAMIDOL (ISOVUE-300) INJECTION 61%
100.0000 mL | Freq: Once | INTRAVENOUS | Status: AC | PRN
  Administered 2018-02-19: 100 mL via INTRAVENOUS

## 2018-02-19 MED ORDER — IOPAMIDOL (ISOVUE-300) INJECTION 61%
INTRAVENOUS | Status: AC
  Filled 2018-02-19: qty 100

## 2018-02-19 MED ORDER — BACITRACIN ZINC 500 UNIT/GM EX OINT
1.0000 "application " | TOPICAL_OINTMENT | Freq: Once | CUTANEOUS | Status: AC
  Administered 2018-02-19: 1 via TOPICAL

## 2018-02-19 MED ORDER — TETANUS-DIPHTH-ACELL PERTUSSIS 5-2.5-18.5 LF-MCG/0.5 IM SUSP: 0.5000 mL | Freq: Once | INTRAMUSCULAR | Status: DC
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
## 2018-02-19 NOTE — Discharge Instructions (Addendum)
Apply ice to help with the swelling, apply antibiotic ointment to skin abrasions

## 2018-03-03 DIAGNOSIS — M25561 Pain in right knee: Secondary | ICD-10-CM | POA: Diagnosis not present

## 2018-03-04 DIAGNOSIS — M25561 Pain in right knee: Secondary | ICD-10-CM | POA: Diagnosis not present

## 2018-03-07 ENCOUNTER — Emergency Department: Payer: 59

## 2018-03-07 ENCOUNTER — Emergency Department
Admission: EM | Admit: 2018-03-07 | Discharge: 2018-03-07 | Disposition: A | Discharge status: Home / Self Care | Payer: 59 | Attending: Emergency Medicine | Admitting: Emergency Medicine

## 2018-03-07 ENCOUNTER — Encounter: Payer: Self-pay | Admitting: Emergency Medicine

## 2018-03-07 DIAGNOSIS — F0781 Postconcussional syndrome: Secondary | ICD-10-CM

## 2018-03-07 DIAGNOSIS — R413 Other amnesia: Secondary | ICD-10-CM | POA: Diagnosis not present

## 2018-03-07 DIAGNOSIS — R51 Headache: Secondary | ICD-10-CM | POA: Diagnosis not present

## 2018-03-07 NOTE — Discharge Instructions (Addendum)
Rest. Drink plenty of fluids.   Follow up this week with Dr. Katrinka Blazing, as discussed. See above. [facility] hotline [phone]. Call today and leave message.  Follow up with your primary care physician this week. Return to Emergency room for new or worsening concerns.

## 2018-03-07 NOTE — ED Notes (Signed)
Pt verbalizes understanding of d/c instructions and f/u
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
## 2018-03-09 ENCOUNTER — Telehealth: Payer: Self-pay
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
## 2018-03-09 NOTE — Telephone Encounter (Signed)
Left message for patient to call back to schedule appointment in concussion clinic.
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
## 2018-03-13 ENCOUNTER — Ambulatory Visit (INDEPENDENT_AMBULATORY_CARE_PROVIDER_SITE_OTHER): Payer: 59 | Admitting: Family Medicine

## 2018-03-13 ENCOUNTER — Encounter: Payer: Self-pay | Admitting: Family Medicine

## 2018-03-13 DIAGNOSIS — G44309 Post-traumatic headache, unspecified, not intractable: Secondary | ICD-10-CM

## 2018-03-13 DIAGNOSIS — F0781 Postconcussional syndrome: Secondary | ICD-10-CM | POA: Diagnosis not present

## 2018-03-13 HISTORY — DX: Postconcussional syndrome: F07.81

## 2018-03-13 MED ORDER — GABAPENTIN 100 MG PO CAPS: 200.0000 mg | ORAL_CAPSULE | Freq: Every day | ORAL | 3 refills | Status: DC
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
## 2018-03-13 NOTE — Patient Instructions (Signed)
Good to see you  Fish oil 3 grams daily  Tart cherry extract any dose at night CoQ10 200-400mg  daily  Gabapentin  at night will help reset nerves See me again in 2 weeks to make sure you are better

## 2018-03-16 DIAGNOSIS — R51 Headache: Secondary | ICD-10-CM | POA: Diagnosis not present

## 2018-03-19 ENCOUNTER — Telehealth: Payer: Self-pay | Admitting: Family Medicine

## 2018-03-19 NOTE — Telephone Encounter (Signed)
Discussed with pt

## 2018-03-19 NOTE — Telephone Encounter (Signed)
Then stop medicine and I will see him at follow up  The CoQ10 should help  Tylenol  3 times daily can help

## 2018-03-19 NOTE — Telephone Encounter (Signed)
Copied from CRM [id number]. Topic: Quick Communication - Rx Refill/Question >> Mar 19, 2018  1:59 PM Alexander Bergeron B wrote: Medication: gabapentin (NEURONTIN) 100 MG capsule [[id number]]   Pt states that he is going to stop taking the medication b/c he feels its causing diarrhea, pt states he still is having headaches as well, call pt to advise, no triage wanted to hear from Dr. Katrinka Blazing nurse
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
## 2018-03-30 ENCOUNTER — Encounter: Payer: Self-pay | Admitting: Family Medicine

## 2018-03-30 ENCOUNTER — Ambulatory Visit (INDEPENDENT_AMBULATORY_CARE_PROVIDER_SITE_OTHER): Payer: 59 | Admitting: Family Medicine

## 2018-03-30 DIAGNOSIS — R102 Pelvic and perineal pain: Secondary | ICD-10-CM | POA: Diagnosis not present

## 2018-03-30 DIAGNOSIS — G43909 Migraine, unspecified, not intractable, without status migrainosus: Secondary | ICD-10-CM | POA: Diagnosis not present

## 2018-03-30 DIAGNOSIS — F0781 Postconcussional syndrome: Secondary | ICD-10-CM

## 2018-03-30 NOTE — Assessment & Plan Note (Signed)
Making progress at this time.  Physical exam is fairly unremarkable.  Patient still having some headaches and is contributing.  Discussed icing regimen.  Follow-up again in 4 weeks

## 2018-03-30 NOTE — Patient Instructions (Signed)
Good to see yo u I think you are making progress Keep trucking along Increase tart cherry to 2 pills at least at night Choline 500mg  daily could help the memory  See me again in 3 weeks

## 2018-03-31 ENCOUNTER — Encounter: Payer: Self-pay | Admitting: Family Medicine

## 2018-04-06 ENCOUNTER — Telehealth: Payer: Self-pay

## 2018-04-06 NOTE — Telephone Encounter (Signed)
Spoke with Harriett SineNancy, patient's wife. She said that patient is frustrated with work and the fact that he also was diagnosed with Peyronie's disease. He is having a hard time concentrating at work. He develops headaches and cannot focus. Harriett Sineancy notes that patient is doing things out of character such as hating certain smells that he use to love and going shirtless outside when he would never go shirtless before. Patient also didn't notice on their security cameras that he was the man that was shirtless in their garden. Patient sleeps throughout the weekend much more than usual. Offered patient appointment in the morning but she declined and chose to come in at next available on Friday.

## 2018-04-06 NOTE — Telephone Encounter (Signed)
Returned call to [name]. Left message to call back.

## 2018-04-06 NOTE — Telephone Encounter (Signed)
Copied from CRM [phone]. Topic: General - Other >> Apr 06, 2018  2:26 PM Gerrianne ScalePayne, Angela L wrote: Reason for CRM: pt wife Harriett Sineancy calling wanting Dr Katrinka BlazingSmith to know that her husband is struggling at work due to concusion and want to know if he qualify for short term disability please give Mrs Harriett Sineancy a call back at [phone] She will go into more detail information when she get a call back
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
## 2018-04-10 ENCOUNTER — Ambulatory Visit (INDEPENDENT_AMBULATORY_CARE_PROVIDER_SITE_OTHER): Payer: 59 | Admitting: Family Medicine

## 2018-04-10 ENCOUNTER — Encounter: Payer: Self-pay | Admitting: Family Medicine

## 2018-04-10 DIAGNOSIS — R41840 Attention and concentration deficit: Secondary | ICD-10-CM | POA: Diagnosis not present

## 2018-04-10 DIAGNOSIS — G44309 Post-traumatic headache, unspecified, not intractable: Secondary | ICD-10-CM

## 2018-04-10 DIAGNOSIS — F0781 Postconcussional syndrome: Secondary | ICD-10-CM

## 2018-04-10 NOTE — Assessment & Plan Note (Signed)
Some mild worsening symptoms.  Discussed icing regimen and home exercise.  Discussed which activities to do which wants to avoid.  Increase activity as tolerated.  Patient will be held out of work for the next week.  Patient continues to have exacerbation with certain activities.  Patient will continue vitamins follow-up again 2 weeks

## 2018-04-10 NOTE — Patient Instructions (Signed)
Good to see you  ALL OF THE VITAMINS for now We will keep you out of work for a week See me again in 1 week

## 2018-04-15 NOTE — Progress Notes (Signed)
[facility] [address] [place], [place] [address] Phone: [phone] Subjective:     CC: Headache follow-up  Maxwell Smith is a 52 y.o. male coming in with complaint of head injury. He has been out of work since last visit. He has felt some improvement. Has been having headaches daily. Also is light sensitivity. Patient states that he feels good when he wakes up but begins to fatigue immediately after arising from the bed. Had to focus hard when looking at the computer to pay bills.    Symptom score 10 today. Previous scores: 22 and 19.     Past Medical History:  Diagnosis Date  . Diverticulitis   . Headache   . Hypertriglyceridemia   . Kidney stone   . Sleep apnea    Past Surgical History:  Procedure Laterality Date  . COLONOSCOPY WITH PROPOFOL N/A 11/20/2015   Procedure: COLONOSCOPY WITH PROPOFOL;  Surgeon: [name], MD;  Location: [facility] ENDOSCOPY;  Service: Endoscopy;  Laterality: N/A;  . ESOPHAGOGASTRODUODENOSCOPY (EGD) WITH PROPOFOL N/A 11/20/2015   Procedure: ESOPHAGOGASTRODUODENOSCOPY (EGD) WITH PROPOFOL;  Surgeon: [name], MD;  Location: [facility] ENDOSCOPY;  Service: Endoscopy;  Laterality: N/A;   Social History   Socioeconomic History  . Marital status: Married    Spouse name: Not on file  . Number of children: Not on file  . Years of education: Not on file  . Highest education level: Not on file  Occupational History  . Not on file  Social Needs  . Financial resource strain: Not on file  . Food insecurity:    Worry: Not on file    Inability: Not on file  . Transportation needs:    Medical: Not on file    Non-medical: Not on file  Tobacco Use  . Smoking status: Never Smoker  . Smokeless tobacco: Never Used  Substance and Sexual Activity  . Alcohol use: Yes  . Drug use: Not on file  . Sexual activity: Not on file  Lifestyle  . Physical activity:    Days per week: Not on file    Minutes per session: Not  on file  . Stress: Not on file  Relationships  . Social connections:    Talks on phone: Not on file    Gets together: Not on file    Attends religious service: Not on file    Active member of club or organization: Not on file    Attends meetings of clubs or organizations: Not on file    Relationship status: Not on file  Other Topics Concern  . Not on file  Social History Narrative  . Not on file   Allergies  Allergen Reactions  . Other Other (See Comments)    Tricort  . Fenofibrate Other (See Comments)   No family history on file.  No family history of autoimmune   Past medical history, social, surgical and family history all reviewed in electronic medical record.  No pertanent information unless stated regarding to the chief complaint.   Review of Systems:Review of systems updated and as accurate as of 04/16/18  No nausea, vomiting, diarrhea, constipation, dizziness, abdominal pain, skin rash, fevers, chills, night sweats, weight loss, swollen lymph nodes, body aches, joint swelling, muscle aches, chest pain, shortness of breath, mood changes.  Positive headache, visual changes  Objective  Blood pressure 118/80, pulse 83, height 6\' 1"  (1.854 m), weight 235 lb (106.6 kg), SpO2 93 %. Systems examined below as of  04/16/18   General: No apparent distress alert and oriented x3 mood and affect normal, dressed appropriately.  Still talks slowly. HEENT: Pupils equal, extraocular movements intact patient though does wear sunglasses at all times when in the room. Respiratory: Patient's speak in full sentences and does not appear short of breath  Cardiovascular: No lower extremity edema, non tender, no erythema  Skin: Warm dry intact with no signs of infection or rash on extremities or on axial skeleton.  Abdomen: Soft nontender  Neuro: Cranial nerves II through XII are intact, neurovascularly intact in all extremities with 2+ DTRs and 2+ pulses.  Lymph: No lymphadenopathy of posterior  or anterior cervical chain or axillae bilaterally.  Gait normal with good balance and coordination.  MSK:  Non tender with full range of motion and good stability and symmetric strength and tone of shoulders, elbows, wrist, hip, knee and ankles bilaterally.     Impression and Recommendations:     This case required medical decision making of moderate complexity.      Note: This dictation was prepared with Dragon dictation along with smaller phrase technology. Any transcriptional errors that result from this process are unintentional.

## 2018-04-16 ENCOUNTER — Encounter: Payer: Self-pay | Admitting: Family Medicine

## 2018-04-16 ENCOUNTER — Ambulatory Visit (INDEPENDENT_AMBULATORY_CARE_PROVIDER_SITE_OTHER): Payer: 59 | Admitting: Family Medicine

## 2018-04-16 DIAGNOSIS — R51 Headache: Secondary | ICD-10-CM | POA: Diagnosis not present

## 2018-04-16 DIAGNOSIS — F0781 Postconcussional syndrome: Secondary | ICD-10-CM

## 2018-04-16 NOTE — Assessment & Plan Note (Signed)
Improvement with patient being out of work for a week.  I do believe the patient is able going to start increasing again.  We will have patient start next week of part-time.  The number that the 1 week trial of full duty.  Follow-up again in 3 weeks for further evaluation.  Continue the vitamin supplementations.

## 2018-04-16 NOTE — Patient Instructions (Signed)
Good to see you  Keep trucking along Part time starting next week for 2 weeks then a trial of full duty for a week  Try to find the reflective screen for the computer Flonase daily each nostril for 2 weeks and try the technique next time you hear ringing.  See me again in 3ish weeks

## 2018-04-18 ENCOUNTER — Encounter: Payer: Self-pay | Admitting: Family Medicine

## 2018-04-20 ENCOUNTER — Ambulatory Visit: Payer: 59 | Admitting: Family Medicine

## 2018-05-05 NOTE — Progress Notes (Signed)
Maxwell ScaleZach Smith D.O. [facility] [address] [place], [place] [address] Phone: [phone] Subjective:     CC: Headache follow-up  OZH:[id number]:Subjective  Maxwell Smith is a 52 y.o. male coming in with complaint of head injury. Patient is back to work full time this week. Patient feels like he has a "slight annoyance" in his head which he says is pressure on the right side. He is able to perform his work but is taking longer to do it. Pain with sexual activity in the right side of head. Ringing in his ears has dissipated. Is still getting headaches daily at work. On Tuesday, he had more fatigue in the morning and got better at work.      Past Medical History:  Diagnosis Date  . Diverticulitis   . Headache   . Hypertriglyceridemia   . Kidney stone   . Sleep apnea    Past Surgical History:  Procedure Laterality Date  . COLONOSCOPY WITH PROPOFOL N/A 11/20/2015   Procedure: COLONOSCOPY WITH PROPOFOL;  Surgeon: Christena DeemMartin U Skulskie, MD;  Location: [facility] ENDOSCOPY;  Service: Endoscopy;  Laterality: N/A;  . ESOPHAGOGASTRODUODENOSCOPY (EGD) WITH PROPOFOL N/A 11/20/2015   Procedure: ESOPHAGOGASTRODUODENOSCOPY (EGD) WITH PROPOFOL;  Surgeon: Christena DeemMartin U Skulskie, MD;  [facility];  Service: Endoscopy;  Laterality: N/A;   Social History   Socioeconomic History  . Marital status: Married    Spouse name: Not on file  . Number of children: Not on file  . Years of education: Not on file  . Highest education level: Not on file  Occupational History  . Not on file  Social Needs  . Financial resource strain: Not on file  . Food insecurity:    Worry: Not on file    Inability: Not on file  . Transportation needs:    Medical: Not on file    Non-medical: Not on file  Tobacco Use  . Smoking status: Never Smoker  . Smokeless tobacco: Never Used  Substance and Sexual Activity  . Alcohol use: Yes  . Drug use: Not on file  . Sexual activity: Not on file  Lifestyle  . Physical  activity:    Days per week: Not on file    Minutes per session: Not on file  . Stress: Not on file  Relationships  . Social connections:    Talks on phone: Not on file    Gets together: Not on file    Attends religious service: Not on file    Active member of club or organization: Not on file    Attends meetings of clubs or organizations: Not on file    Relationship status: Not on file  Other Topics Concern  . Not on file  Social History Narrative  . Not on file   Allergies  Allergen Reactions  . Other Other (See Comments)    Tricort  . Fenofibrate Other (See Comments)   No family history on file.  No family history of autoimmune   Past medical history, social, surgical and family history all reviewed in electronic medical record.  No pertanent information unless stated regarding to the chief complaint.   Review of Systems:Review of systems updated and as accurate as of 05/07/18  No , nausea, vomiting, diarrhea, constipation, dizziness, abdominal pain, skin rash, fevers, chills, night sweats, weight loss, swollen lymph nodes, body aches, joint swelling, muscle aches, chest pain, shortness of breath, mood changes.  Positive muscle aches,, headaches, visual changes  Objective  Blood pressure 114/84, pulse  79, height 6\' 1"  (1.854 m), weight 232 lb (105.2 kg), SpO2 97 %. Systems examined below as of 05/07/18   General: No apparent distress alert and oriented x3 mood and affect normal, dressed appropriately.  HEENT: Pupils equal, mild nystagmus still remaining Respiratory: Patient's speak in full sentences and does not appear short of breath  Cardiovascular: No lower extremity edema, non tender, no erythema  Skin: Warm dry intact with no signs of infection or rash on extremities or on axial skeleton.  Abdomen: Soft nontender  Neuro: Cranial nerves II through XII are intact, neurovascularly intact in all extremities with 2+ DTRs and 2+ pulses.  Lymph: No lymphadenopathy of  posterior or anterior cervical chain or axillae bilaterally.  Gait normal with good balance and coordination.  MSK:  Non tender with full range of motion and good stability and symmetric strength and tone of shoulders, elbows, wrist, hip, knee and ankles bilaterally.  Neck: Inspection mild loss of lordosis. No palpable stepoffs. Negative Spurling's maneuver. Loss of lordosis and loss of sidebending. Grip strength and sensation normal in bilateral hands Strength good C4 to T1 distribution No sensory change to C4 to T1 Negative Hoffman sign bilaterally Reflexes normal    Impression and Recommendations:     This case required medical decision making of moderate complexity.      Note: This dictation was prepared with Dragon dictation along with smaller phrase technology. Any transcriptional errors that result from this process are unintentional.

## 2018-05-07 ENCOUNTER — Ambulatory Visit (INDEPENDENT_AMBULATORY_CARE_PROVIDER_SITE_OTHER): Payer: 59 | Admitting: Family Medicine

## 2018-05-07 ENCOUNTER — Encounter: Payer: Self-pay | Admitting: Family Medicine

## 2018-05-07 VITALS — BP 114/84 | HR 79 | Ht 73.0 in | Wt 232.0 lb

## 2018-05-07 DIAGNOSIS — R51 Headache: Secondary | ICD-10-CM

## 2018-05-07 DIAGNOSIS — G4482 Headache associated with sexual activity: Secondary | ICD-10-CM | POA: Diagnosis not present

## 2018-05-07 DIAGNOSIS — R519 Headache, unspecified: Secondary | ICD-10-CM

## 2018-05-07 NOTE — Assessment & Plan Note (Signed)
I believe the patient's postconcussive symptoms have kind of improved the patient is now having more of a headache syndrome.  Patient has been to the headache clinic previously but with the new symptoms now of worsening headache with sexual activity concern for vascular pathology.  Feel an MRI brain with and without contrast is necessary at this time and with history of injury a sinus venous thrombosis must be ruled out.  Patient has taken an aspirin at this moment.  We discussed also the differential includes brain aneurysm, brain tumor, or complex migraine.  After the imaging if all is normal I do believe the patient will be able to start sexual activity again as well as work with no significant restrictions.  Spent  25 minutes with patient face-to-face and had greater than 50% of counseling including as described above in assessment and plan.

## 2018-05-07 NOTE — Patient Instructions (Signed)
Too long of symptoms and don't like som eof the new information We will get MRI of the brain and make sure everything is ok  Wait to make an appointment and we will discuss after the MRI

## 2018-05-14 DIAGNOSIS — G4733 Obstructive sleep apnea (adult) (pediatric): Secondary | ICD-10-CM | POA: Diagnosis not present

## 2018-05-16 ENCOUNTER — Ambulatory Visit
Admission: RE | Admit: 2018-05-16 | Discharge: 2018-05-16 | Disposition: A | Discharge status: Home / Self Care | Payer: 59 | Source: Ambulatory Visit | Attending: Family Medicine | Admitting: Family Medicine

## 2018-05-16 DIAGNOSIS — S0990XA Unspecified injury of head, initial encounter: Secondary | ICD-10-CM | POA: Diagnosis not present

## 2018-05-16 DIAGNOSIS — R519 Headache, unspecified: Secondary | ICD-10-CM

## 2018-05-16 DIAGNOSIS — R410 Disorientation, unspecified: Secondary | ICD-10-CM | POA: Diagnosis not present

## 2018-05-16 DIAGNOSIS — R51 Headache: Principal | ICD-10-CM

## 2018-05-16 MED ORDER — GADOBENATE DIMEGLUMINE 529 MG/ML IV SOLN
20.0000 mL | Freq: Once | INTRAVENOUS | Status: AC | PRN
  Administered 2018-05-16: 20 mL via INTRAVENOUS

## 2018-05-18 ENCOUNTER — Encounter: Payer: Self-pay | Admitting: Family Medicine

## 2018-06-30 DIAGNOSIS — Z0000 Encounter for general adult medical examination without abnormal findings: Secondary | ICD-10-CM | POA: Diagnosis not present

## 2018-06-30 DIAGNOSIS — G43709 Chronic migraine without aura, not intractable, without status migrainosus: Secondary | ICD-10-CM | POA: Diagnosis not present

## 2018-06-30 DIAGNOSIS — E7800 Pure hypercholesterolemia, unspecified: Secondary | ICD-10-CM | POA: Diagnosis not present

## 2018-07-03 DIAGNOSIS — E7800 Pure hypercholesterolemia, unspecified: Secondary | ICD-10-CM | POA: Diagnosis not present

## 2018-07-03 DIAGNOSIS — Z0000 Encounter for general adult medical examination without abnormal findings: Secondary | ICD-10-CM | POA: Diagnosis not present

## 2018-09-28 DIAGNOSIS — N200 Calculus of kidney: Secondary | ICD-10-CM | POA: Diagnosis not present

## 2018-09-28 DIAGNOSIS — N486 Induration penis plastica: Secondary | ICD-10-CM | POA: Diagnosis not present

## 2018-10-29 DIAGNOSIS — G44209 Tension-type headache, unspecified, not intractable: Secondary | ICD-10-CM | POA: Diagnosis not present

## 2018-10-29 DIAGNOSIS — M9901 Segmental and somatic dysfunction of cervical region: Secondary | ICD-10-CM | POA: Diagnosis not present

## 2018-10-30 DIAGNOSIS — M9901 Segmental and somatic dysfunction of cervical region: Secondary | ICD-10-CM | POA: Diagnosis not present

## 2018-10-30 DIAGNOSIS — G44209 Tension-type headache, unspecified, not intractable: Secondary | ICD-10-CM | POA: Diagnosis not present

## 2018-11-02 DIAGNOSIS — G44209 Tension-type headache, unspecified, not intractable: Secondary | ICD-10-CM | POA: Diagnosis not present

## 2018-11-02 DIAGNOSIS — M9901 Segmental and somatic dysfunction of cervical region: Secondary | ICD-10-CM | POA: Diagnosis not present

## 2018-11-05 DIAGNOSIS — G44209 Tension-type headache, unspecified, not intractable: Secondary | ICD-10-CM | POA: Diagnosis not present

## 2018-11-05 DIAGNOSIS — M9901 Segmental and somatic dysfunction of cervical region: Secondary | ICD-10-CM | POA: Diagnosis not present

## 2018-11-09 DIAGNOSIS — M9901 Segmental and somatic dysfunction of cervical region: Secondary | ICD-10-CM | POA: Diagnosis not present

## 2018-11-09 DIAGNOSIS — G44209 Tension-type headache, unspecified, not intractable: Secondary | ICD-10-CM | POA: Diagnosis not present

## 2018-11-12 DIAGNOSIS — M9901 Segmental and somatic dysfunction of cervical region: Secondary | ICD-10-CM | POA: Diagnosis not present

## 2018-11-12 DIAGNOSIS — G44209 Tension-type headache, unspecified, not intractable: Secondary | ICD-10-CM | POA: Diagnosis not present

## 2018-11-18 ENCOUNTER — Encounter: Payer: Self-pay | Admitting: Family Medicine

## 2018-11-18 DIAGNOSIS — M9901 Segmental and somatic dysfunction of cervical region: Secondary | ICD-10-CM | POA: Diagnosis not present

## 2018-11-18 DIAGNOSIS — G44209 Tension-type headache, unspecified, not intractable: Secondary | ICD-10-CM | POA: Diagnosis not present

## 2018-11-19 ENCOUNTER — Encounter: Payer: Self-pay | Admitting: Family Medicine

## 2018-11-19 ENCOUNTER — Other Ambulatory Visit: Payer: Self-pay

## 2018-11-19 DIAGNOSIS — R51 Headache: Principal | ICD-10-CM

## 2018-11-19 DIAGNOSIS — R519 Headache, unspecified: Secondary | ICD-10-CM

## 2018-12-01 DIAGNOSIS — M9901 Segmental and somatic dysfunction of cervical region: Secondary | ICD-10-CM | POA: Diagnosis not present

## 2018-12-01 DIAGNOSIS — G44209 Tension-type headache, unspecified, not intractable: Secondary | ICD-10-CM | POA: Diagnosis not present

## 2019-01-25 ENCOUNTER — Ambulatory Visit: Payer: 59 | Admitting: Neurology

## 2019-02-24 ENCOUNTER — Other Ambulatory Visit: Payer: Self-pay
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
## 2019-02-25 ENCOUNTER — Telehealth (INDEPENDENT_AMBULATORY_CARE_PROVIDER_SITE_OTHER): Payer: 59 | Admitting: Neurology

## 2019-02-25 ENCOUNTER — Encounter: Payer: Self-pay | Admitting: Neurology

## 2019-02-25 DIAGNOSIS — G44329 Chronic post-traumatic headache, not intractable: Secondary | ICD-10-CM | POA: Diagnosis not present

## 2019-02-25 DIAGNOSIS — R6889 Other general symptoms and signs: Secondary | ICD-10-CM

## 2019-02-25 DIAGNOSIS — R413 Other amnesia: Secondary | ICD-10-CM

## 2019-02-25 MED ORDER — NORTRIPTYLINE HCL 10 MG PO CAPS: 10.0000 mg | ORAL_CAPSULE | Freq: Every day | ORAL | 0 refills | Status: DC

## 2019-02-25 NOTE — Addendum Note (Signed)
Addended by: Dorthy Cooler on: 02/25/2019 12:56 PM   Modules accepted: Orders

## 2019-02-25 NOTE — Patient Instructions (Signed)
1.  Will start nortriptyline 10mg  at bedtime.  He will contact me in 4 weeks with update and we can increase to 25mg  at bedtime if needed. 2.  He may use Excedrin but limited to no more than 2 days out of week to prevent rebound headache 3.  Advised to increase water intake, increase exercise and stop other caffeine intake. 4.  Will schedule neuropsychological testing 5.  Will check TSH and B12 to evaluate for other causes of fatigue and memory deficits.

## 2019-03-02 ENCOUNTER — Ambulatory Visit: Payer: 59 | Admitting: Neurology

## 2019-03-19 ENCOUNTER — Other Ambulatory Visit: Payer: Self-pay

## 2019-03-19 ENCOUNTER — Other Ambulatory Visit (INDEPENDENT_AMBULATORY_CARE_PROVIDER_SITE_OTHER): Payer: 59

## 2019-03-19 ENCOUNTER — Telehealth: Payer: Self-pay

## 2019-03-19 DIAGNOSIS — R6889 Other general symptoms and signs: Secondary | ICD-10-CM | POA: Diagnosis not present

## 2019-03-19 DIAGNOSIS — R413 Other amnesia: Secondary | ICD-10-CM | POA: Diagnosis not present

## 2019-03-19 LAB — TSH: TSH: 1.05 u[IU]/mL (ref 0.35–4.50)

## 2019-03-19 LAB — VITAMIN B12: Vitamin B-12: 542 pg/mL (ref 211–911)

## 2019-03-19 NOTE — Telephone Encounter (Signed)
Called and advised Pt of lab results 

## 2019-03-19 NOTE — Telephone Encounter (Signed)
-----   Message from Drema Dallas, DO sent at 03/19/2019  1:30 PM EDT ----- Labs are normal

## 2019-03-20 ENCOUNTER — Other Ambulatory Visit: Payer: Self-pay | Admitting: Neurology

## 2019-03-25 NOTE — Telephone Encounter (Signed)
error 

## 2019-04-01 ENCOUNTER — Other Ambulatory Visit: Payer: Self-pay

## 2019-04-01 MED ORDER — NORTRIPTYLINE HCL 25 MG PO CAPS: 25.0000 mg | ORAL_CAPSULE | Freq: Every day | ORAL | 3 refills | Status: DC

## 2019-05-11 ENCOUNTER — Other Ambulatory Visit: Payer: Self-pay

## 2019-05-11 DIAGNOSIS — G4482 Headache associated with sexual activity: Secondary | ICD-10-CM

## 2019-05-17 ENCOUNTER — Other Ambulatory Visit: Payer: Self-pay

## 2019-05-17 MED ORDER — NORTRIPTYLINE HCL 50 MG PO CAPS: 50.0000 mg | ORAL_CAPSULE | Freq: Every day | ORAL | 3 refills | Status: DC

## 2019-05-25 ENCOUNTER — Ambulatory Visit
Admission: RE | Admit: 2019-05-25 | Discharge: 2019-05-25 | Disposition: A | Discharge status: Home / Self Care | Payer: 59 | Source: Ambulatory Visit | Attending: Neurology | Admitting: Neurology

## 2019-05-25 DIAGNOSIS — G4482 Headache associated with sexual activity: Secondary | ICD-10-CM

## 2019-05-25 MED ORDER — IOPAMIDOL (ISOVUE-370) INJECTION 76%
75.0000 mL | Freq: Once | INTRAVENOUS | Status: AC | PRN
  Administered 2019-05-25: 75 mL via INTRAVENOUS

## 2019-05-26 ENCOUNTER — Telehealth: Payer: Self-pay | Admitting: Neurology

## 2019-05-26 NOTE — Telephone Encounter (Signed)
Pt wants to know if we got the results back for his scan he had done yesterday and what the results are please call

## 2019-05-27 ENCOUNTER — Telehealth: Payer: Self-pay

## 2019-05-27 NOTE — Telephone Encounter (Signed)
Called spoke with patient aware/ understands results

## 2019-05-27 NOTE — Telephone Encounter (Signed)
-----   Message from Pieter Partridge, DO sent at 05/26/2019 12:10 PM EDT ----- CTA of head and neck are normal

## 2019-05-27 NOTE — Telephone Encounter (Signed)
See other note Patient aware of CTA HEAD/ NECK results today.

## 2019-06-16 ENCOUNTER — Other Ambulatory Visit: Payer: Self-pay

## 2019-06-16 ENCOUNTER — Ambulatory Visit (INDEPENDENT_AMBULATORY_CARE_PROVIDER_SITE_OTHER): Payer: 59 | Admitting: Surgery

## 2019-06-16 ENCOUNTER — Encounter: Payer: Self-pay | Admitting: Surgery

## 2019-06-16 VITALS — BP 145/98 | HR 97 | Temp 97.5°F | Ht 73.0 in | Wt 244.8 lb

## 2019-06-16 DIAGNOSIS — K5732 Diverticulitis of large intestine without perforation or abscess without bleeding: Secondary | ICD-10-CM | POA: Diagnosis not present

## 2019-06-16 DIAGNOSIS — K5792 Diverticulitis of intestine, part unspecified, without perforation or abscess without bleeding: Secondary | ICD-10-CM

## 2019-06-16 MED ORDER — METRONIDAZOLE 500 MG PO TABS: 500.0000 mg | ORAL_TABLET | Freq: Three times a day (TID) | ORAL | 0 refills | Status: AC

## 2019-06-16 MED ORDER — CIPROFLOXACIN HCL 500 MG PO TABS: 500.0000 mg | ORAL_TABLET | Freq: Two times a day (BID) | ORAL | 0 refills | Status: AC

## 2019-06-16 NOTE — Patient Instructions (Addendum)
The patient is aware to call back for any questions or new concerns.  CT scan on Wednesday 06-23-19 at 3:30 at [facility] Nothing to eat or drink 4 hours before the scan Go by and pick up prep kit prior to scan Labs

## 2019-06-17 NOTE — Progress Notes (Signed)
Patient ID: Maxwell Smith, male   DOB: Jun 10, 1966, [age] y.o.   MRN: [id number]  HPI Maxwell Smith is a 53 y.o. male in consultation at the request of Dr. Dareen PianoAnderson for a questionable umbilical hernia.  He reports that he started having periumbilical pain and left lower quadrant pain for the last several days.  The pain is intermittent moderate in intensity and sharp in nature.  There is no specific alleviating or aggravating factors.  The patient did tell me that he did have a history of diverticulosis or diverticulitis at some point in time.  He denies any fevers any chills. Did have a CT scan of the abdomen and pelvis that I have personally review in 2019 with out evidence of major abdominal pathology other than diverticulosis and a small umbilical hernia. He denies any previous major abdominal interventions. Is able to perform more than 4 METS of activity without any shortness of breath or chest pain.  His MOST recent CBD,  BMP was completely normal  HPI  Past Medical History:  Diagnosis Date  . Diverticulitis   . Headache   . Hypertriglyceridemia   . Kidney stone   . Sleep apnea     Past Surgical History:  Procedure Laterality Date  . COLONOSCOPY WITH PROPOFOL N/A 11/20/2015   Procedure: COLONOSCOPY WITH PROPOFOL;  Surgeon: Christena DeemMartin U Skulskie, MD;  [facility];  Service: Endoscopy;  Laterality: N/A;  . ESOPHAGOGASTRODUODENOSCOPY (EGD) WITH PROPOFOL N/A 11/20/2015   Procedure: ESOPHAGOGASTRODUODENOSCOPY (EGD) WITH PROPOFOL;  Surgeon: Christena DeemMartin U Skulskie, MD;  Location[facility] ENDOSCOPY;  Service: Endoscopy;  Laterality: N/A;  . WISDOM TOOTH EXTRACTION      Family History  Problem Relation Age of Onset  . Kidney disease Mother   . Heart attack Mother   . Heart disease Father     Social History Social History   Tobacco Use  . Smoking status: Former Games developermoker  . Smokeless tobacco: Never Used  Substance Use Topics  . Alcohol use: Never    Frequency: Never  . Drug use:  Never    Allergies  Allergen Reactions  . Other Other (See Comments)    Tricort  . Fenofibrate Other (See Comments)    Current Outpatient Medications  Medication Sig Dispense Refill  . nortriptyline (PAMELOR) 50 MG capsule Take 1 capsule (50 mg total) by mouth daily. 90 capsule 3  . OVER THE COUNTER MEDICATION Take 1 tablet by mouth daily. Doterra    . ciprofloxacin (CIPRO) 500 MG tablet Take 1 tablet (500 mg total) by mouth 2 (two) times daily for 14 days. 28 tablet 0  . metroNIDAZOLE (FLAGYL) 500 MG tablet Take 1 tablet (500 mg total) by mouth 3 (three) times daily for 14 days. 42 tablet 0   No current facility-administered medications for this visit.      Review of Systems Full ROS  was asked and was negative except for the information on the HPI  Physical Exam Blood pressure (!) 145/98, pulse 97, temperature (!) 97.5 F (36.4 C), temperature source Temporal, height 6\' 1"  (1.854 m), weight 244 lb 12.8 oz (111 kg), SpO2 96 %. CONSTITUTIONAL: NAD EYES: Pupils are equal, round, and reactive to light, Sclera are non-icteric. EARS, NOSE, MOUTH AND THROAT: The oropharynx is clear. The oral mucosa is pink and moist. Hearing is intact to voice. LYMPH NODES:  Lymph nodes in the neck are normal. RESPIRATORY:  Lungs are clear. There is normal respiratory effort, with equal breath sounds bilaterally, and without pathologic  use of accessory muscles. CARDIOVASCULAR: Heart is regular without murmurs, gallops, or rubs. GI: The abdomen is  Soft, there is tenderness palpation in the periumbilical area and to the left lower quadrant.  There is no peritonitis.  There is a reducible small umbilical hernia.  There are no palpable masses. There is no hepatosplenomegaly. There are normal bowel sounds in all quadrants. GU: Rectal deferred.   MUSCULOSKELETAL: Normal muscle strength and tone. No cyanosis or edema.   SKIN: Turgor is good and there are no pathologic skin lesions or ulcers. NEUROLOGIC:  Motor and sensation is grossly normal. Cranial nerves are grossly intact. PSYCH:  Oriented to person, place and time. Affect is normal.  Data Reviewed  I have personally reviewed the patient's imaging, laboratory findings and medical records.    Assessment/Plan 53 year-old male with left-sided abdominal Pain concerning for diverticulitis.  Of note he does have an umbilical CERT hernia that seems to be mildly symptomatic.  At this time I do not think that his anemia is causing the left lower quadrant abdominal pain.  Given all these findings I do recommend a CT scan of the abdomen and pelvis to evaluate for diverticulitis or any other acute intra-abdominal pathology.  Also obtain a CBC and a BMP.  I will see him back after he completes the work-up.  He may need to have the umbilical hernia repair at some point in time but the first order of business is to make sure there is no other acute intra-abdominal pathology. I WILL Empirically treat him with Cipro and Flagyl for presumed diverticulitis A copy of this report was sent to the referring provider  Caroleen Hamman, MD FACS General Surgeon 06/17/2019, 5:49 PM

## 2019-06-21 ENCOUNTER — Encounter: Payer: Self-pay | Admitting: Surgery

## 2019-06-23 ENCOUNTER — Ambulatory Visit
Admission: RE | Admit: 2019-06-23 | Discharge: 2019-06-23 | Disposition: A | Discharge status: Home / Self Care | Payer: 59 | Source: Ambulatory Visit | Attending: Surgery | Admitting: Surgery

## 2019-06-23 ENCOUNTER — Other Ambulatory Visit: Payer: Self-pay

## 2019-06-23 DIAGNOSIS — K5792 Diverticulitis of intestine, part unspecified, without perforation or abscess without bleeding: Secondary | ICD-10-CM | POA: Diagnosis not present

## 2019-06-23 MED ORDER — IOHEXOL 300 MG/ML  SOLN
100.0000 mL | Freq: Once | INTRAMUSCULAR | Status: AC | PRN
  Administered 2019-06-23: 100 mL via INTRAVENOUS

## 2019-06-24 ENCOUNTER — Telehealth: Payer: Self-pay

## 2019-06-24 NOTE — Telephone Encounter (Signed)
Patient notified of Ct results, reminded of follow up appointment 07/12/19.
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
## 2019-06-28 ENCOUNTER — Telehealth (INDEPENDENT_AMBULATORY_CARE_PROVIDER_SITE_OTHER): Payer: 59 | Admitting: Neurology

## 2019-06-28 ENCOUNTER — Encounter: Payer: Self-pay | Admitting: Neurology

## 2019-06-28 ENCOUNTER — Other Ambulatory Visit: Payer: Self-pay

## 2019-06-28 VITALS — Ht 71.0 in | Wt 235.0 lb

## 2019-06-28 DIAGNOSIS — G44309 Post-traumatic headache, unspecified, not intractable: Secondary | ICD-10-CM

## 2019-06-28 DIAGNOSIS — R413 Other amnesia: Secondary | ICD-10-CM

## 2019-06-28 NOTE — Addendum Note (Signed)
Addended by: Jesse Fall on: 06/28/2019 02:28 PM   Modules accepted: Orders

## 2019-07-12 ENCOUNTER — Encounter: Payer: Self-pay | Admitting: Surgery

## 2019-07-12 ENCOUNTER — Ambulatory Visit: Payer: Self-pay | Admitting: Surgery

## 2019-07-12 ENCOUNTER — Ambulatory Visit (INDEPENDENT_AMBULATORY_CARE_PROVIDER_SITE_OTHER): Payer: 59 | Admitting: Surgery

## 2019-07-12 ENCOUNTER — Other Ambulatory Visit: Payer: Self-pay

## 2019-07-12 VITALS — BP 141/96 | HR 102 | Temp 97.7°F | Ht 71.0 in | Wt 239.8 lb

## 2019-07-12 DIAGNOSIS — K429 Umbilical hernia without obstruction or gangrene: Secondary | ICD-10-CM

## 2019-07-12 DIAGNOSIS — K4090 Unilateral inguinal hernia, without obstruction or gangrene, not specified as recurrent: Secondary | ICD-10-CM

## 2019-07-12 NOTE — H&P (View-Only) (Signed)
Outpatient Surgical Follow Up  07/12/2019  Maxwell Smith is an 53 y.o. male.     HPI: Maxwell Smith is a 53-year-old male well-known to me with a prior history of symptomatic umbilical hernia and abdominal pain.  At that time I was more concerned about diverticulitis and I did obtain a CT scan of the abdomen and pelvis which I have personally reviewed showing evidence of diverticulosis and umbilical hernia and a new right inguinal hernia.  He was prescribed antibiotics and his symptoms of his left lower quadrant pain significantly improved.  He has now recovered.  He exhibited no abdominal pain but still feels the umbilical hernia.  He denies any fevers any chills any shortness of breath or chest pain.  He is able to perform more than 4 METS of activity without any shortness of breath or chest pain. He did have colonoscopy 2017 showing diverticulosis but no polyps or masses.  Past Medical History:  Diagnosis Date  . Diverticulitis   . Headache   . Hernia cerebri (HCC)    has umbilical and inguinal and appt with surgeon soon 06/2019  . Hypertriglyceridemia   . Kidney stone   . Sleep apnea     Past Surgical History:  Procedure Laterality Date  . COLONOSCOPY WITH PROPOFOL N/A 11/20/2015   Procedure: COLONOSCOPY WITH PROPOFOL;  Surgeon: Martin U Skulskie, MD;  Location: [facility] ENDOSCOPY;  Service: Endoscopy;  Laterality: N/A;  . ESOPHAGOGASTRODUODENOSCOPY (EGD) WITH PROPOFOL N/A 11/20/2015   Procedure: ESOPHAGOGASTRODUODENOSCOPY (EGD) WITH PROPOFOL;  Surgeon: Martin U Skulskie, MD;  Location: [facility] ENDOSCOPY;  Service: Endoscopy;  Laterality: N/A;  . WISDOM TOOTH EXTRACTION      Family History  Problem Relation Age of Onset  . Kidney disease Mother   . Heart attack Mother   . Heart disease Father     Social History:  reports that he has quit smoking. He has never used smokeless tobacco. He reports that he does not drink alcohol or use drugs.  Allergies:  Allergies  Allergen Reactions  .  Other Other (See Comments)    Tricort  . Fenofibrate Other (See Comments)    Medications reviewed.    ROS Full ROS performed and is otherwise negative other than what is stated in HPI   BP (!) 141/96   Pulse (!) 102   Temp 97.7 F (36.5 C)   Ht 5' 11" (1.803 m)   Wt 239 lb 12.8 oz (108.8 kg)   SpO2 95%   BMI 33.45 kg/m   Physical Exam Vitals signs and nursing note reviewed. Exam conducted with a chaperone present.  Constitutional:      General: He is not in acute distress.    Appearance: Normal appearance. He is normal weight.  Eyes:     General: No scleral icterus.       Right eye: No discharge.        Left eye: No discharge.  Cardiovascular:     Rate and Rhythm: Normal rate and regular rhythm.     Heart sounds: No murmur. No friction rub. No gallop.   Pulmonary:     Effort: Pulmonary effort is normal. No respiratory distress.     Breath sounds: No stridor. No wheezing.  Abdominal:     General: Abdomen is flat. There is no distension.     Palpations: There is no mass.     Tenderness: There is no abdominal tenderness. There is no guarding.     Hernia: A hernia is present.       Comments: Reducible UH and small RIH,   Musculoskeletal: Normal range of motion.  Skin:    General: Skin is warm and dry.     Capillary Refill: Capillary refill takes less than 2 seconds.     Coloration: Skin is not jaundiced.  Neurological:     General: No focal deficit present.     Mental Status: He is alert and oriented to person, place, and time.     Cranial Nerves: No cranial nerve deficit.     Sensory: No sensory deficit.  Psychiatric:        Mood and Affect: Mood normal.        Behavior: Behavior normal.        Thought Content: Thought content normal.        Judgment: Judgment normal.     Assessment/Plan: Symptomatic umbilical hernia as well as a right inguinal hernia.  I had an extensive discussion with the patient regarding the disease process.  I definitely recommend  repair and given that he does have right inguinal hernia we can also repair this robotically.  Procedure discussed with the patient in detail.  Risk, benefits and possible complications including but not limited to: Bleeding, infection, mesh issues, chronic pain, nerve injuries.  He understands and wishes to proceed.  Extensive counseling provided   Greater than 50% of the 40 minutes  visit was spent in counseling/coordination of care   Caroleen Hamman, MD [place] [facility]

## 2019-07-12 NOTE — Progress Notes (Signed)
Outpatient Surgical Follow Up  07/12/2019  Langston ReusingVictor E Smith is an 53 y.o. male.     HPI: Maxwell LemmingVictor is a 53 year old male well-known to me with a prior history of symptomatic umbilical hernia and abdominal pain.  At that time I was more concerned about diverticulitis and I did obtain a CT scan of the abdomen and pelvis which I have personally reviewed showing evidence of diverticulosis and umbilical hernia and a new right inguinal hernia.  He was prescribed antibiotics and his symptoms of his left lower quadrant pain significantly improved.  He has now recovered.  He exhibited no abdominal pain but still feels the umbilical hernia.  He denies any fevers any chills any shortness of breath or chest pain.  He is able to perform more than 4 METS of activity without any shortness of breath or chest pain. He did have colonoscopy 2017 showing diverticulosis but no polyps or masses.  Past Medical History:  Diagnosis Date  . Diverticulitis   . Headache   . Hernia cerebri [facility](HCC)    has umbilical and inguinal and appt with surgeon soon 06/2019  . Hypertriglyceridemia   . Kidney stone   . Sleep apnea     Past Surgical History:  Procedure Laterality Date  . COLONOSCOPY WITH PROPOFOL N/A 11/20/2015   Procedure: COLONOSCOPY WITH PROPOFOL;  Surgeon: Christena DeemMartin U Skulskie, MD;  [facility];  Service: Endoscopy;  Laterality: N/A;  . ESOPHAGOGASTRODUODENOSCOPY (EGD) WITH PROPOFOL N/A 11/20/2015   Procedure: ESOPHAGOGASTRODUODENOSCOPY (EGD) WITH PROPOFOL;  Surgeon: Christena DeemMartin U Skulskie, MD;  [facility];  Service: Endoscopy;  Laterality: N/A;  . WISDOM TOOTH EXTRACTION      Family History  Problem Relation Age of Onset  . Kidney disease Mother   . Heart attack Mother   . Heart disease Father     Social History:  reports that he has quit smoking. He has never used smokeless tobacco. He reports that he does not drink alcohol or use drugs.  Allergies:  Allergies  Allergen Reactions  .  Other Other (See Comments)    Tricort  . Fenofibrate Other (See Comments)    Medications reviewed.    ROS Full ROS performed and is otherwise negative other than what is stated in HPI   BP (!) 141/96   Pulse (!) 102   Temp 97.7 F (36.5 C)   Ht 5\' 11"  (1.803 m)   Wt 239 lb 12.8 oz (108.8 kg)   SpO2 95%   BMI 33.45 kg/m   Physical Exam Vitals signs and nursing note reviewed. Exam conducted with a chaperone present.  Constitutional:      General: He is not in acute distress.    Appearance: Normal appearance. He is normal weight.  Eyes:     General: No scleral icterus.       Right eye: No discharge.        Left eye: No discharge.  Cardiovascular:     Rate and Rhythm: Normal rate and regular rhythm.     Heart sounds: No murmur. No friction rub. No gallop.   Pulmonary:     Effort: Pulmonary effort is normal. No respiratory distress.     Breath sounds: No stridor. No wheezing.  Abdominal:     General: Abdomen is flat. There is no distension.     Palpations: There is no mass.     Tenderness: There is no abdominal tenderness. There is no guarding.     Hernia: A hernia is present.  Comments: Reducible UH and small RIH,   Musculoskeletal: Normal range of motion.  Skin:    General: Skin is warm and dry.     Capillary Refill: Capillary refill takes less than 2 seconds.     Coloration: Skin is not jaundiced.  Neurological:     General: No focal deficit present.     Mental Status: He is alert and oriented to person, place, and time.     Cranial Nerves: No cranial nerve deficit.     Sensory: No sensory deficit.  Psychiatric:        Mood and Affect: Mood normal.        Behavior: Behavior normal.        Thought Content: Thought content normal.        Judgment: Judgment normal.     Assessment/Plan: Symptomatic umbilical hernia as well as a right inguinal hernia.  I had an extensive discussion with the patient regarding the disease process.  I definitely recommend  repair and given that he does have right inguinal hernia we can also repair this robotically.  Procedure discussed with the patient in detail.  Risk, benefits and possible complications including but not limited to: Bleeding, infection, mesh issues, chronic pain, nerve injuries.  He understands and wishes to proceed.  Extensive counseling provided   Greater than 50% of the 40 minutes  visit was spent in counseling/coordination of care   Caroleen Hamman, MD [place] [facility]

## 2019-07-12 NOTE — Patient Instructions (Signed)
You have chose to have your hernia repaired. This will be done by Dr. Pabon at [facility].  Please see your (blue) Pre-care information that you have been given today.  You will need to arrange to be out of work for 2 weeks and then return with a lifting restrictions for 4 more weeks. Please send any FMLA paperwork prior to surgery and we will fill this out and fax it back to your employer within 3 business days.  You may have a bruise in your groin and also swelling and brusing in your testicle area. You may use ice 4-5 times daily for 15-20 minutes each time. Make sure that you place a barrier between you and the ice pack. To decrease the swelling, you may roll up a bath towel and place it vertically in between your thighs with your testicles resting on the towel. You will want to keep this area elevated as much as possible for several days following surgery.    Inguinal Hernia, Adult Muscles help keep everything in the body in its proper place. But if a weak spot in the muscles develops, something can poke through. That is called a hernia. When this happens in the lower part of the belly (abdomen), it is called an inguinal hernia. (It takes its name from a part of the body in this region called the inguinal canal.) A weak spot in the wall of muscles lets some fat or part of the small intestine bulge through. An inguinal hernia can develop at any age. Men get them more often than women. CAUSES  In adults, an inguinal hernia develops over time.  It can be triggered by:  Suddenly straining the muscles of the lower abdomen.  Lifting heavy objects.  Straining to have a bowel movement. Difficult bowel movements (constipation) can lead to this.  Constant coughing. This may be caused by smoking or lung disease.  Being overweight.  Being pregnant.  Working at a job that requires long periods of standing or heavy lifting.  Having had an inguinal hernia before. One type can be an emergency  situation. It is called a strangulated inguinal hernia. It develops if part of the small intestine slips through the weak spot and cannot get back into the abdomen. The blood supply can be cut off. If that happens, part of the intestine may die. This situation requires emergency surgery. SYMPTOMS  Often, a small inguinal hernia has no symptoms. It is found when a healthcare provider does a physical exam. Larger hernias usually have symptoms.   In adults, symptoms may include:  A lump in the groin. This is easier to see when the person is standing. It might disappear when lying down.  In men, a lump in the scrotum.  Pain or burning in the groin. This occurs especially when lifting, straining or coughing.  A dull ache or feeling of pressure in the groin.  Signs of a strangulated hernia can include:  A bulge in the groin that becomes very painful and tender to the touch.  A bulge that turns red or purple.  Fever, nausea and vomiting.  Inability to have a bowel movement or to pass gas. DIAGNOSIS  To decide if you have an inguinal hernia, a healthcare provider will probably do a physical examination.  This will include asking questions about any symptoms you have noticed.  The healthcare provider might feel the groin area and ask you to cough. If an inguinal hernia is felt, the healthcare provider may try   to slide it back into the abdomen.  Usually no other tests are needed. TREATMENT  Treatments can vary. The size of the hernia makes a difference. Options include:  Watchful waiting. This is often suggested if the hernia is small and you have had no symptoms.  No medical procedure will be done unless symptoms develop.  You will need to watch closely for symptoms. If any occur, contact your healthcare provider right away.  Surgery. This is used if the hernia is larger or you have symptoms.  Open surgery. This is usually an outpatient procedure (you will not stay overnight in a  hospital). An cut (incision) is made through the skin in the groin. The hernia is put back inside the abdomen. The weak area in the muscles is then repaired by herniorrhaphy or hernioplasty. Herniorrhaphy: in this type of surgery, the weak muscles are sewn back together. Hernioplasty: a patch or mesh is used to close the weak area in the abdominal wall.  Laparoscopy. In this procedure, a surgeon makes small incisions. A thin tube with a tiny video camera (called a laparoscope) is put into the abdomen. The surgeon repairs the hernia with mesh by looking with the video camera and using two long instruments. HOME CARE INSTRUCTIONS   After surgery to repair an inguinal hernia:  You will need to take pain medicine prescribed by your healthcare provider. Follow all directions carefully.  You will need to take care of the wound from the incision.  Your activity will be restricted for awhile. This will probably include no heavy lifting for several weeks. You also should not do anything too active for a few weeks. When you can return to work will depend on the type of job that you have.  During "watchful waiting" periods, you should:  Maintain a healthy weight.  Eat a diet high in fiber (fruits, vegetables and whole grains).  Drink plenty of fluids to avoid constipation. This means drinking enough water and other liquids to keep your urine clear or pale yellow.  Do not lift heavy objects.  Do not stand for long periods of time.  Quit smoking. This should keep you from developing a frequent cough. SEEK MEDICAL CARE IF:   A bulge develops in your groin area.  You feel pain, a burning sensation or pressure in the groin. This might be worse if you are lifting or straining.  You develop a fever of more than 100.5 F (38.1 C). SEEK IMMEDIATE MEDICAL CARE IF:   Pain in the groin increases suddenly.  A bulge in the groin gets bigger suddenly and does not go down.  For men, there is sudden pain  in the scrotum. Or, the size of the scrotum increases.  A bulge in the groin area becomes red or purple and is painful to touch.  You have nausea or vomiting that does not go away.  You feel your heart beating much faster than normal.  You cannot have a bowel movement or pass gas.  You develop a fever of more than 102.0 F (38.9 C).   This information is not intended to replace advice given to you by your health care provider. Make sure you discuss any questions you have with your health care provider.   Document Released: 03/02/2009 Document Revised: 01/06/2012 Document Reviewed: 04/17/2015 Elsevier Interactive Patient Education 2016 [employer] Inc.   

## 2019-07-13 ENCOUNTER — Telehealth: Payer: Self-pay | Admitting: Surgery

## 2019-07-13 NOTE — Telephone Encounter (Signed)
Pt has been advised of pre admission date/time, Covid Testing date and Surgery date.  Surgery Date: 07/27/19 with Dr Kris Mouton assisted right inguinal hernia repair and umbilical hernia repair.  Preadmission Testing Date: 07/20/19 between 8-1:00pm-phone interview.  Covid Testing Date: 07/23/19 between 8-10:30am - patient advised to go to the [facility]  [employer] Video sent via [employer] [facility] and [employer].  Patient has been made aware to call [phone], between 1-3:00pm the day before surgery, to find out what time to arrive.

## 2019-07-14 ENCOUNTER — Telehealth: Payer: Self-pay | Admitting: Surgery

## 2019-07-14 NOTE — Telephone Encounter (Signed)
Patient is calling and said he will needing a letter for his work, that it will be okay for the patient to work from home after surgery for two weeks and then returning. Please call patient and advise.

## 2019-07-14 NOTE — Telephone Encounter (Signed)
Note placed at front desk for patient to pick up. 

## 2019-07-19 NOTE — Addendum Note (Signed)
Addended by: Caroleen Hamman F on: 07/19/2019 09:40 AM   Modules accepted: Orders, SmartSet

## 2019-07-20 ENCOUNTER — Other Ambulatory Visit: Payer: Self-pay

## 2019-07-20 ENCOUNTER — Encounter
Admission: RE | Admit: 2019-07-20 | Discharge: 2019-07-20 | Disposition: A | Discharge status: Home / Self Care | Payer: 59 | Source: Ambulatory Visit | Attending: Surgery | Admitting: Surgery

## 2019-07-20 HISTORY — DX: Personal history of urinary calculi: Z87.442

## 2019-07-20 NOTE — Patient Instructions (Signed)
Your procedure is scheduled on: Tuesday 07/27/19 Report to [facility]. To find out your arrival time please call [phone] between 1PM - 3PM on Monday 07/26/19.  Remember: Instructions that are not followed completely may result in serious medical risk, up to and including death, or upon the discretion of your surgeon and anesthesiologist your surgery may need to be rescheduled.     _X__ 1. Do not eat food after midnight the night before your procedure.                 No gum chewing or hard candies. You may drink clear liquids up to 2 hours                 before you are scheduled to arrive for your surgery- DO not drink clear                 liquids within 2 hours of the start of your surgery.                 Clear Liquids include:  water, apple juice without pulp, clear carbohydrate                 drink such as Clearfast or Gatorade, Black Coffee or Tea (Do not add                 anything to coffee or tea). Diabetics water only  __X__2.  On the morning of surgery brush your teeth with toothpaste and water, you                 may rinse your mouth with mouthwash if you wish.  Do not swallow any              toothpaste of mouthwash.     _X__ 3.  No Alcohol for 24 hours before or after surgery.   _X__ 4.  Do Not Smoke or use e-cigarettes For 24 Hours Prior to Your Surgery.                 Do not use any chewable tobacco products for at least 6 hours prior to                 surgery.  ____  5.  Bring all medications with you on the day of surgery if instructed.   __X__  6.  Notify your doctor if there is any change in your medical condition      (cold, fever, infections).     Do not wear jewelry, make-up, hairpins, clips or nail polish. Do not wear lotions, powders, or perfumes.  Do not shave 48 hours prior to surgery. Men may shave face and neck. Do not bring valuables to the hospital.    [facility] is not responsible for  any belongings or valuables.  Contacts, dentures/partials or body piercings may not be worn into surgery. Bring a case for your contacts, glasses or hearing aids, a denture cup will be supplied. Leave your suitcase in the car. After surgery it may be brought to your room. For patients admitted to the hospital, discharge time is determined by your treatment team.   Patients discharged the day of surgery will not be allowed to drive home.   Please read over the following fact sheets that you were given:   MRSA Information  __X__ Take these medicines the morning of surgery with A SIP OF WATER:  1. none  2.   3.   4.  5.  6.  ____ Fleet Enema (as directed)   __X__ Use CHG Soap/SAGE wipes as directed  ____ Use inhalers on the day of surgery  ____ Stop metformin/Janumet/Farxiga 2 days prior to surgery    ____ Take 1/2 of usual insulin dose the night before surgery. No insulin the morning          of surgery.   ____ Stop Blood Thinners Coumadin/Plavix/Xarelto/Pleta/Pradaxa/Eliquis/Effient/Aspirin  on   Or contact your Surgeon, Cardiologist or Medical Doctor regarding  ability to stop your blood thinners  __X__ Stop Anti-inflammatories 7 days before surgery such as Advil, Ibuprofen, Motrin,  BC or Goodies Powder, Naprosyn, Naproxen, Aleve, Aspirin    __X__ Stop all herbal supplements, fish oil or vitamin E until after surgery. Stop Glucosamine and DoTerra   ____ Bring C-Pap to the hospital.

## 2019-07-23 ENCOUNTER — Inpatient Hospital Stay: Admission: RE | Admit: 2019-07-23 | Payer: 59 | Source: Ambulatory Visit

## 2019-07-23 ENCOUNTER — Encounter
Admission: RE | Admit: 2019-07-23 | Discharge: 2019-07-23 | Disposition: A | Discharge status: Home / Self Care | Payer: 59 | Source: Ambulatory Visit | Attending: Surgery | Admitting: Surgery

## 2019-07-23 ENCOUNTER — Other Ambulatory Visit: Payer: Self-pay

## 2019-07-23 ENCOUNTER — Other Ambulatory Visit: Payer: 59

## 2019-07-23 DIAGNOSIS — Z20828 Contact with and (suspected) exposure to other viral communicable diseases: Secondary | ICD-10-CM | POA: Diagnosis not present

## 2019-07-23 DIAGNOSIS — K429 Umbilical hernia without obstruction or gangrene: Secondary | ICD-10-CM | POA: Insufficient documentation

## 2019-07-23 DIAGNOSIS — Z01812 Encounter for preprocedural laboratory examination: Secondary | ICD-10-CM | POA: Diagnosis not present

## 2019-07-23 DIAGNOSIS — G4733 Obstructive sleep apnea (adult) (pediatric): Secondary | ICD-10-CM | POA: Insufficient documentation

## 2019-07-23 LAB — CBC
HCT: 43.8 % (ref 39.0–52.0)
Hemoglobin: 14.9 g/dL (ref 13.0–17.0)
MCH: 31.6 pg (ref 26.0–34.0)
MCHC: 34 g/dL (ref 30.0–36.0)
MCV: 93 fL (ref 80.0–100.0)
Platelets: 192 10*3/uL (ref 150–400)
RBC: 4.71 MIL/uL (ref 4.22–5.81)
RDW: 12.1 % (ref 11.5–15.5)
WBC: 5.8 10*3/uL (ref 4.0–10.5)
nRBC: 0 % (ref 0.0–0.2)

## 2019-07-23 LAB — BASIC METABOLIC PANEL
Anion gap: 10 (ref 5–15)
BUN: 10 mg/dL (ref 6–20)
CO2: 25 mmol/L (ref 22–32)
Calcium: 8.9 mg/dL (ref 8.9–10.3)
Chloride: 103 mmol/L (ref 98–111)
Creatinine, Ser: 0.77 mg/dL (ref 0.61–1.24)
GFR calc Af Amer: >60 mL/min (ref >60)
GFR calc non Af Amer: >60 mL/min (ref >60)
Glucose, Bld: 101 mg/dL — ABNORMAL HIGH (ref 70–99)
Potassium: 3.7 mmol/L (ref 3.5–5.1)
Sodium: 138 mmol/L (ref 135–145)

## 2019-07-23 LAB — SARS CORONAVIRUS 2 (TAT 6-24 HRS): SARS Coronavirus 2: NEGATIVE

## 2019-07-23 NOTE — Pre-Procedure Instructions (Signed)
Ekg reviewed and OK to proceed without further work up

## 2019-07-23 NOTE — Pre-Procedure Instructions (Signed)
Requested Dr. Randa Lynn to review EKG done today and advise.

## 2019-07-27 ENCOUNTER — Ambulatory Visit: Payer: 59 | Admitting: Anesthesiology

## 2019-07-27 ENCOUNTER — Encounter: Payer: Self-pay | Admitting: *Deleted

## 2019-07-27 ENCOUNTER — Ambulatory Visit
Admission: RE | Admit: 2019-07-27 | Discharge: 2019-07-27 | Disposition: A | Discharge status: Home / Self Care | Payer: 59 | Source: Ambulatory Visit | Attending: Surgery | Admitting: Surgery

## 2019-07-27 ENCOUNTER — Encounter
Admission: RE | Disposition: A | Discharge status: Home / Self Care | Payer: Self-pay | Source: Ambulatory Visit | Attending: Surgery

## 2019-07-27 DIAGNOSIS — G4730 Sleep apnea, unspecified: Secondary | ICD-10-CM | POA: Diagnosis not present

## 2019-07-27 DIAGNOSIS — E781 Pure hyperglyceridemia: Secondary | ICD-10-CM | POA: Insufficient documentation

## 2019-07-27 DIAGNOSIS — K429 Umbilical hernia without obstruction or gangrene: Secondary | ICD-10-CM | POA: Diagnosis not present

## 2019-07-27 DIAGNOSIS — K4090 Unilateral inguinal hernia, without obstruction or gangrene, not specified as recurrent: Secondary | ICD-10-CM | POA: Insufficient documentation

## 2019-07-27 DIAGNOSIS — Z79899 Other long term (current) drug therapy: Secondary | ICD-10-CM | POA: Insufficient documentation

## 2019-07-27 HISTORY — PX: XI ROBOTIC ASSISTED INGUINAL HERNIA REPAIR WITH MESH: SHX6706

## 2019-07-27 HISTORY — PX: UMBILICAL HERNIA REPAIR: SHX196

## 2019-07-27 SURGERY — REPAIR, HERNIA, INGUINAL, ROBOT-ASSISTED, LAPAROSCOPIC, USING MESH
Anesthesia: General | Laterality: Right

## 2019-07-27 MED ORDER — PHENYLEPHRINE HCL (PRESSORS) 10 MG/ML IV SOLN
INTRAVENOUS | Status: DC | PRN
  Administered 2019-07-27 (×3): 100 ug via INTRAVENOUS
  Administered 2019-07-27 (×2): 50 ug via INTRAVENOUS

## 2019-07-27 MED ORDER — MIDAZOLAM HCL 2 MG/2ML IJ SOLN
INTRAMUSCULAR | Status: DC | PRN
  Administered 2019-07-27: 2 mg via INTRAVENOUS

## 2019-07-27 MED ORDER — ONDANSETRON HCL 4 MG/2ML IJ SOLN
INTRAMUSCULAR | Status: DC | PRN
  Administered 2019-07-27: 4 mg via INTRAVENOUS

## 2019-07-27 MED ORDER — FENTANYL CITRATE (PF) 100 MCG/2ML IJ SOLN
INTRAMUSCULAR | Status: DC | PRN
  Administered 2019-07-27 (×5): 50 ug via INTRAVENOUS

## 2019-07-27 MED ORDER — DEXMEDETOMIDINE HCL IN NACL 200 MCG/50ML IV SOLN
INTRAVENOUS | Status: DC | PRN
  Administered 2019-07-27: 4 ug via INTRAVENOUS
  Administered 2019-07-27: 8 ug via INTRAVENOUS

## 2019-07-27 MED ORDER — HYDROCODONE-ACETAMINOPHEN 5-325 MG PO TABS: 1.0000 | ORAL_TABLET | Freq: Four times a day (QID) | ORAL | 0 refills | Status: AC | PRN

## 2019-07-27 MED ORDER — EPHEDRINE SULFATE 50 MG/ML IJ SOLN
INTRAMUSCULAR | Status: DC | PRN
  Administered 2019-07-27: 5 mg via INTRAVENOUS

## 2019-07-27 MED ORDER — PROMETHAZINE HCL 25 MG/ML IJ SOLN: 6.2500 mg | INTRAMUSCULAR | Status: DC | PRN

## 2019-07-27 MED ORDER — SUGAMMADEX SODIUM 200 MG/2ML IV SOLN
INTRAVENOUS | Status: DC | PRN
  Administered 2019-07-27: 220 mg via INTRAVENOUS

## 2019-07-27 MED ORDER — CHLORHEXIDINE GLUCONATE CLOTH 2 % EX PADS
6.0000 | MEDICATED_PAD | Freq: Once | CUTANEOUS | Status: AC
  Administered 2019-07-27: 6 via TOPICAL

## 2019-07-27 MED ORDER — DEXAMETHASONE SODIUM PHOSPHATE 10 MG/ML IJ SOLN
INTRAMUSCULAR | Status: DC | PRN
  Administered 2019-07-27: 10 mg via INTRAVENOUS

## 2019-07-27 MED ORDER — OXYCODONE HCL 5 MG/5ML PO SOLN: 5.0000 mg | Freq: Once | ORAL | Status: AC | PRN

## 2019-07-27 MED ORDER — ACETAMINOPHEN 500 MG PO TABS
1000.0000 mg | ORAL_TABLET | ORAL | Status: AC
  Administered 2019-07-27: 08:00:00 1000 mg via ORAL

## 2019-07-27 MED ORDER — OXYCODONE HCL 5 MG PO TABS
5.0000 mg | ORAL_TABLET | Freq: Once | ORAL | Status: AC | PRN
  Administered 2019-07-27: 5 mg via ORAL

## 2019-07-27 MED ORDER — ACETAMINOPHEN 500 MG PO TABS
ORAL_TABLET | ORAL | Status: AC
  Administered 2019-07-27: 1000 mg via ORAL
  Filled 2019-07-27: qty 2

## 2019-07-27 MED ORDER — ROCURONIUM BROMIDE 100 MG/10ML IV SOLN
INTRAVENOUS | Status: DC | PRN
  Administered 2019-07-27: 10 mg via INTRAVENOUS
  Administered 2019-07-27: 45 mg via INTRAVENOUS
  Administered 2019-07-27: 5 mg via INTRAVENOUS
  Administered 2019-07-27: 20 mg via INTRAVENOUS

## 2019-07-27 MED ORDER — ONDANSETRON HCL 4 MG/2ML IJ SOLN
INTRAMUSCULAR | Status: AC
  Filled 2019-07-27: qty 2

## 2019-07-27 MED ORDER — MEPERIDINE HCL 50 MG/ML IJ SOLN: 6.2500 mg | INTRAMUSCULAR | Status: DC | PRN

## 2019-07-27 MED ORDER — GABAPENTIN 300 MG PO CAPS
ORAL_CAPSULE | ORAL | Status: AC
  Filled 2019-07-27: qty 1

## 2019-07-27 MED ORDER — MIDAZOLAM HCL 2 MG/2ML IJ SOLN
INTRAMUSCULAR | Status: AC
  Filled 2019-07-27: qty 2

## 2019-07-27 MED ORDER — ROCURONIUM BROMIDE 50 MG/5ML IV SOLN
INTRAVENOUS | Status: AC
  Filled 2019-07-27: qty 2

## 2019-07-27 MED ORDER — LIDOCAINE HCL (CARDIAC) PF 100 MG/5ML IV SOSY
PREFILLED_SYRINGE | INTRAVENOUS | Status: DC | PRN
  Administered 2019-07-27: 100 mg via INTRAVENOUS

## 2019-07-27 MED ORDER — PROPOFOL 10 MG/ML IV BOLUS
INTRAVENOUS | Status: AC
  Filled 2019-07-27: qty 20

## 2019-07-27 MED ORDER — DEXMEDETOMIDINE HCL IN NACL 80 MCG/20ML IV SOLN
INTRAVENOUS | Status: AC
  Filled 2019-07-27: qty 20

## 2019-07-27 MED ORDER — PROPOFOL 10 MG/ML IV BOLUS
INTRAVENOUS | Status: DC | PRN
  Administered 2019-07-27: 200 mg via INTRAVENOUS

## 2019-07-27 MED ORDER — BUPIVACAINE-EPINEPHRINE (PF) 0.25% -1:200000 IJ SOLN
INTRAMUSCULAR | Status: AC
  Filled 2019-07-27: qty 30

## 2019-07-27 MED ORDER — CHLORHEXIDINE GLUCONATE CLOTH 2 % EX PADS: 6.0000 | MEDICATED_PAD | Freq: Once | CUTANEOUS | Status: DC

## 2019-07-27 MED ORDER — SUCCINYLCHOLINE CHLORIDE 20 MG/ML IJ SOLN
INTRAMUSCULAR | Status: DC | PRN
  Administered 2019-07-27: 100 mg via INTRAVENOUS

## 2019-07-27 MED ORDER — FENTANYL CITRATE (PF) 250 MCG/5ML IJ SOLN
INTRAMUSCULAR | Status: AC
  Filled 2019-07-27: qty 5

## 2019-07-27 MED ORDER — OXYCODONE HCL 5 MG PO TABS
ORAL_TABLET | ORAL | Status: AC
  Filled 2019-07-27: qty 1

## 2019-07-27 MED ORDER — FENTANYL CITRATE (PF) 100 MCG/2ML IJ SOLN: 25.0000 ug | INTRAMUSCULAR | Status: DC | PRN

## 2019-07-27 MED ORDER — DEXAMETHASONE SODIUM PHOSPHATE 10 MG/ML IJ SOLN
INTRAMUSCULAR | Status: AC
  Filled 2019-07-27: qty 1

## 2019-07-27 MED ORDER — BUPIVACAINE-EPINEPHRINE 0.25% -1:200000 IJ SOLN
INTRAMUSCULAR | Status: DC | PRN
  Administered 2019-07-27: 30 mL

## 2019-07-27 MED ORDER — FAMOTIDINE 20 MG PO TABS
ORAL_TABLET | ORAL | Status: AC
  Administered 2019-07-27: 20 mg via ORAL
  Filled 2019-07-27: qty 1

## 2019-07-27 MED ORDER — CEFAZOLIN SODIUM-DEXTROSE 2-4 GM/100ML-% IV SOLN
2.0000 g | INTRAVENOUS | Status: AC
  Administered 2019-07-27: 10:00:00 2 g via INTRAVENOUS

## 2019-07-27 MED ORDER — GABAPENTIN 300 MG PO CAPS
300.0000 mg | ORAL_CAPSULE | ORAL | Status: AC
  Administered 2019-07-27: 300 mg via ORAL

## 2019-07-27 MED ORDER — CELECOXIB 200 MG PO CAPS
ORAL_CAPSULE | ORAL | Status: AC
  Filled 2019-07-27: qty 1

## 2019-07-27 MED ORDER — FAMOTIDINE 20 MG PO TABS
20.0000 mg | ORAL_TABLET | Freq: Once | ORAL | Status: AC
  Administered 2019-07-27: 08:00:00 20 mg via ORAL

## 2019-07-27 MED ORDER — SUGAMMADEX SODIUM 500 MG/5ML IV SOLN
INTRAVENOUS | Status: AC
  Filled 2019-07-27: qty 5

## 2019-07-27 MED ORDER — CEFAZOLIN SODIUM-DEXTROSE 2-4 GM/100ML-% IV SOLN
INTRAVENOUS | Status: AC
  Filled 2019-07-27: qty 100

## 2019-07-27 MED ORDER — LACTATED RINGERS IV SOLN
INTRAVENOUS | Status: DC
  Administered 2019-07-27: 08:00:00 via INTRAVENOUS

## 2019-07-27 MED ORDER — CELECOXIB 200 MG PO CAPS
200.0000 mg | ORAL_CAPSULE | ORAL | Status: AC
  Administered 2019-07-27: 200 mg via ORAL

## 2019-07-27 MED ORDER — EPHEDRINE SULFATE 50 MG/ML IJ SOLN
INTRAMUSCULAR | Status: AC
  Filled 2019-07-27: qty 1

## 2019-07-27 SURGICAL SUPPLY — 58 items
APPLIER CLIP 11 MED OPEN (CLIP) ×4
BLADE CLIPPER SURG (BLADE) ×8 IMPLANT
BLADE SURG 15 STRL LF DISP TIS (BLADE) ×2 IMPLANT
BLADE SURG 15 STRL SS (BLADE) ×2
CANISTER SUCT 1200ML W/VALVE (MISCELLANEOUS) ×4 IMPLANT
CHLORAPREP W/TINT 26 (MISCELLANEOUS) ×4 IMPLANT
CLIP APPLIE 11 MED OPEN (CLIP) ×2 IMPLANT
COVER TIP SHEARS 8 DVNC (MISCELLANEOUS) ×2 IMPLANT
COVER TIP SHEARS 8MM DA VINCI (MISCELLANEOUS) ×2
COVER WAND RF STERILE (DRAPES) ×4 IMPLANT
DEFOGGER SCOPE WARMER CLEARIFY (MISCELLANEOUS) ×4 IMPLANT
DERMABOND ADVANCED (GAUZE/BANDAGES/DRESSINGS) ×2
DERMABOND ADVANCED .7 DNX12 (GAUZE/BANDAGES/DRESSINGS) ×2 IMPLANT
DRAPE 3/4 80X56 (DRAPES) ×4 IMPLANT
DRAPE ARM DVNC X/XI (DISPOSABLE) ×6 IMPLANT
DRAPE COLUMN DVNC XI (DISPOSABLE) ×2 IMPLANT
DRAPE DA VINCI XI ARM (DISPOSABLE) ×6
DRAPE DA VINCI XI COLUMN (DISPOSABLE) ×2
DRAPE INCISE IOBAN 66X45 STRL (DRAPES) ×4 IMPLANT
DRAPE LAPAROTOMY 77X122 PED (DRAPES) ×4 IMPLANT
DRSG TELFA 3X8 NADH (GAUZE/BANDAGES/DRESSINGS) ×4 IMPLANT
ELECT CAUTERY BLADE 6.4 (BLADE) ×4 IMPLANT
ELECT REM PT RETURN 9FT ADLT (ELECTROSURGICAL) ×4
ELECTRODE REM PT RTRN 9FT ADLT (ELECTROSURGICAL) ×2 IMPLANT
GLOVE BIO SURGEON STRL SZ7 (GLOVE) ×16 IMPLANT
GOWN STRL REUS W/ TWL LRG LVL3 (GOWN DISPOSABLE) ×8 IMPLANT
GOWN STRL REUS W/TWL LRG LVL3 (GOWN DISPOSABLE) ×8
IRRIGATION STRYKERFLOW (MISCELLANEOUS) ×2 IMPLANT
IRRIGATOR STRYKERFLOW (MISCELLANEOUS) ×4
IV NS 1000ML (IV SOLUTION)
IV NS 1000ML BAXH (IV SOLUTION) IMPLANT
KIT PINK PAD W/HEAD ARE REST (MISCELLANEOUS) ×4
KIT PINK PAD W/HEAD ARM REST (MISCELLANEOUS) ×2 IMPLANT
LABEL OR SOLS (LABEL) ×4 IMPLANT
MESH 3DMAX 4X6 RT LRG (Mesh General) ×4 IMPLANT
NEEDLE HYPO 22GX1.5 SAFETY (NEEDLE) ×4 IMPLANT
NS IRRIG 500ML POUR BTL (IV SOLUTION) ×4 IMPLANT
OBTURATOR OPTICAL STANDARD 8MM (TROCAR) ×2
OBTURATOR OPTICAL STND 8 DVNC (TROCAR) ×2
OBTURATOR OPTICALSTD 8 DVNC (TROCAR) ×2 IMPLANT
PACK BASIN MINOR ARMC (MISCELLANEOUS) ×4 IMPLANT
PACK LAP CHOLECYSTECTOMY (MISCELLANEOUS) ×4 IMPLANT
PENCIL ELECTRO HAND CTR (MISCELLANEOUS) ×4 IMPLANT
SEAL CANN UNIV 5-8 DVNC XI (MISCELLANEOUS) ×6 IMPLANT
SEAL XI 5MM-8MM UNIVERSAL (MISCELLANEOUS) ×6
SOLUTION ELECTROLUBE (MISCELLANEOUS) ×4 IMPLANT
SPONGE LAP 18X18 RF (DISPOSABLE) ×4 IMPLANT
SUT ETHIBOND NAB MO 7 #0 18IN (SUTURE) ×4 IMPLANT
SUT MNCRL AB 4-0 PS2 18 (SUTURE) ×4 IMPLANT
SUT VIC AB 2-0 SH 27 (SUTURE) ×4
SUT VIC AB 2-0 SH 27XBRD (SUTURE) ×4 IMPLANT
SUT VIC AB 3-0 SH 27 (SUTURE) ×2
SUT VIC AB 3-0 SH 27X BRD (SUTURE) ×2 IMPLANT
SUT VICRYL 0 AB UR-6 (SUTURE) ×8 IMPLANT
SUT VLOC 90 S/L VL9 GS22 (SUTURE) ×4 IMPLANT
SYR 20ML LL LF (SYRINGE) ×4 IMPLANT
TROCAR BALLN GELPORT 12X130M (ENDOMECHANICALS) ×4 IMPLANT
TUBING EVAC SMOKE HEATED PNEUM (TUBING) ×4 IMPLANT

## 2019-07-27 NOTE — Anesthesia Postprocedure Evaluation (Signed)
Anesthesia Post Note  Patient: REO PORTELA  Procedure(s) Performed: XI ROBOTIC ASSISTED INGUINAL HERNIA REPAIR WITH MESH (Right ) HERNIA REPAIR UMBILICAL ADULT (N/A )  Patient location during evaluation: PACU Anesthesia Type: General Level of consciousness: awake and alert and oriented Pain management: pain level controlled Vital Signs Assessment: post-procedure vital signs reviewed and stable Respiratory status: spontaneous breathing, nonlabored ventilation and respiratory function stable Cardiovascular status: blood pressure returned to baseline and stable Postop Assessment: no signs of nausea or vomiting Anesthetic complications: no     Last Vitals:  Vitals:   07/27/19 1253 07/27/19 1338  BP: 130/84 (!) 143/92  Pulse: (!) 108 (!) 108  Resp: 18 18  Temp: (!) 36.1 C (!) 36.3 C  SpO2: 94% 96%    Last Pain:  Vitals:   07/27/19 1338  TempSrc: Temporal  PainSc: 3                  Adonus Uselman

## 2019-07-27 NOTE — Anesthesia Procedure Notes (Signed)
Procedure Name: Intubation Date/Time: 07/27/2019 10:13 AM Performed by: Lavone Orn, CRNA Pre-anesthesia Checklist: Patient identified, Emergency Drugs available, Suction available, Patient being monitored and Timeout performed Patient Re-evaluated:Patient Re-evaluated prior to induction Oxygen Delivery Method: Circle system utilized Preoxygenation: Pre-oxygenation with 100% oxygen Induction Type: IV induction Ventilation: Oral airway inserted - appropriate to patient size and Mask ventilation without difficulty Laryngoscope Size: Mac and 4 Grade View: Grade II Tube type: Oral Tube size: 7.5 mm Number of attempts: 1 Airway Equipment and Method: Stylet Placement Confirmation: ETT inserted through vocal cords under direct vision,  positive ETCO2 and breath sounds checked- equal and bilateral Secured at: 22 cm Tube secured with: Tape Dental Injury: Teeth and Oropharynx as per pre-operative assessment

## 2019-07-27 NOTE — Anesthesia Post-op Follow-up Note (Signed)
Anesthesia QCDR form completed.        

## 2019-07-27 NOTE — Anesthesia Preprocedure Evaluation (Signed)
Anesthesia Evaluation  Patient identified by MRN, date of birth, ID band Patient awake    Reviewed: Allergy & Precautions, NPO status , Patient's Chart, lab work & pertinent test results  History of Anesthesia Complications Negative for: history of anesthetic complications  Airway Mallampati: II  TM Distance: >3 FB Neck ROM: Full    Dental no notable dental hx.    Pulmonary sleep apnea , neg COPD, former smoker,    breath sounds clear to auscultation- rhonchi (-) wheezing      Cardiovascular Exercise Tolerance: Good (-) hypertension(-) CAD, (-) Past MI, (-) Cardiac Stents and (-) CABG  Rhythm:Regular Rate:Normal - Systolic murmurs and - Diastolic murmurs    Neuro/Psych  Headaches, neg Seizures PSYCHIATRIC DISORDERS    GI/Hepatic negative GI ROS, Neg liver ROS,   Endo/Other  negative endocrine ROSneg diabetes  Renal/GU negative Renal ROS     Musculoskeletal negative musculoskeletal ROS (+)   Abdominal (+) + obese,   Peds  Hematology negative hematology ROS (+)   Anesthesia Other Findings Past Medical History: No date: Diverticulitis No date: Headache No date: Hernia cerebri (HCC)     Comment:  has umbilical and inguinal and appt with surgeon soon               06/2019 No date: History of kidney stones No date: Hypertriglyceridemia No date: Sleep apnea   Reproductive/Obstetrics                             Anesthesia Physical Anesthesia Plan  ASA: II  Anesthesia Plan: General   Post-op Pain Management:    Induction: Intravenous  PONV Risk Score and Plan: 1 and Ondansetron and Dexamethasone  Airway Management Planned: Oral ETT  Additional Equipment:   Intra-op Plan:   Post-operative Plan: Extubation in OR  Informed Consent: I have reviewed the patients History and Physical, chart, labs and discussed the procedure including the risks, benefits and alternatives for the  proposed anesthesia with the patient or authorized representative who has indicated his/her understanding and acceptance.     Dental advisory given  Plan Discussed with: CRNA and Anesthesiologist  Anesthesia Plan Comments:         Anesthesia Quick Evaluation

## 2019-07-27 NOTE — Transfer of Care (Signed)
Immediate Anesthesia Transfer of Care Note  Patient: Maxwell Smith  Procedure(s) Performed: XI ROBOTIC ASSISTED INGUINAL HERNIA REPAIR WITH MESH (Right ) HERNIA REPAIR UMBILICAL ADULT (N/A )  Patient Location: PACU  Anesthesia Type:General  Level of Consciousness: sedated  Airway & Oxygen Therapy: Patient Spontanous Breathing and Patient connected to face mask oxygen  Post-op Assessment: Report given to RN and Post -op Vital signs reviewed and stable  Post vital signs: Reviewed  Last Vitals:  Vitals Value Taken Time  BP 131/95 07/27/19 1152  Temp    Pulse 94 07/27/19 1152  Resp 21 07/27/19 1152  SpO2 96 % 07/27/19 1152  Vitals shown include unvalidated device data.  Last Pain:  Vitals:   07/27/19 0806  TempSrc: Tympanic  PainSc: 0-No pain         Complications: No apparent anesthesia complications

## 2019-07-27 NOTE — Op Note (Signed)
Robotic assisted Laparoscopic Transabdominal Right Inguinal Hernia Repair with 3 D large Mesh, Primary repair umbilical hernia     Pre-operative Diagnosis:  Right Inguinal and umbilical  Hernias   Post-operative Diagnosis: Same   Procedure: Robotic  Laparoscopic  repair of Right inguinal, open repair of umbilical hernia   Surgeon: Caroleen Hamman, MD FACS   Anesthesia: Gen. with endotracheal tube   Findings: Right indirect inguinal hernia, UH        Procedure Details  The patient was seen again in the Holding Room. The benefits, complications, treatment options, and expected outcomes were discussed with the patient. The risks of bleeding, infection, recurrence of symptoms, failure to resolve symptoms, recurrence of hernia, ischemic orchitis, chronic pain syndrome or neuroma, were discussed again. The likelihood of improving the patient's symptoms with return to their baseline status is good.  The patient and/or family concurred with the proposed plan, giving informed consent.  The patient was taken to Operating Room, identified  and the procedure verified as Laparoscopic Inguinal Hernia Repair. Laterality confirmed.  A Time Out was held and the above information confirmed.   Prior to the induction of general anesthesia, antibiotic prophylaxis was administered. VTE prophylaxis was in place. General endotracheal anesthesia was then administered and tolerated well. After the induction, the abdomen was prepped with Chloraprep and draped in the sterile fashion. The patient was positioned in the supine position.   Supraumbilical incision created sac was dissected free and removed. Defect measured 1.5 cms. Fascia was cleaned  the abdominal cavity entered under direct visualization, a hasson trochar placed. Pneumoperitoneum obtained w/o HD changes. No evidence of bowel injuries.  Two 8 mm placed under direct vision. The laparoscopy revealed large indirect defects. I inserted the needles and the mesh. The  robot was brought ot the table and docked in the standard fashion, no collision between arms was observed. Instruments were kept under direct view at all times. We started on the right side were a flap was created. The sac was reduced and dissected free from adjacent structures. We preserved the vas and the vessels. Once dissection was completed a large 3D mesh was placed and secured with two interrupted vicryl attached to the pubic tubercle. There was good coverage of the direct, indirect and femoral spaces. The flap was closed with v lock suture. Second look revealed no complications or injuries.    Once assuring that hemostasis was adequate the ports were removed and the umbilical defect closed with   Multiple 0 ethibond sutures. 4-0 subcuticular Monocryl was used at all skin edges. Dermabond was placed.  Patient tolerated the procedure well. There were no complications. He was taken to the recovery room in stable condition.             Caroleen Hamman, MD, FACS

## 2019-07-27 NOTE — Interval H&P Note (Signed)
History and Physical Interval Note:  07/27/2019 9:38 AM  Maxwell Smith  has presented today for surgery, with the diagnosis of Right Inguinal hernia and umbilical hernia.  The various methods of treatment have been discussed with the patient and family. After consideration of risks, benefits and other options for treatment, the patient has consented to  Procedure(s): XI ROBOTIC [place] (Right) HERNIA REPAIR UMBILICAL ADULT (N/A) as a surgical intervention.  The patient's history has been reviewed, patient examined, no change in status, stable for surgery.  I have reviewed the patient's chart and labs.  Questions were answered to the patient's satisfaction.     [place]

## 2019-07-27 NOTE — Discharge Instructions (Addendum)
Laparoscopic Inguinal Hernia Repair, Adult, Care After °This sheet gives you information about how to care for yourself after your procedure. Your health care provider may also give you more specific instructions. If you have problems or questions, contact your health care provider. °What can I expect after the procedure? °After the procedure, it is common to have: °· Pain. °· Swelling and bruising around the incision area. °· Scrotal swelling, in men. °· Some fluid or blood draining from your incisions. °Follow these instructions at home: °Incision care °· Follow instructions from your health care provider about how to take care of your incisions. Make sure you: °? Wash your hands with soap and water before you change your bandage (dressing). If soap and water are not available, use hand sanitizer. °? Change your dressing as told by your health care provider. °? Leave stitches (sutures), skin glue, or adhesive strips in place. These skin closures may need to stay in place for 2 weeks or longer. If adhesive strip edges start to loosen and curl up, you may trim the loose edges. Do not remove adhesive strips completely unless your health care provider tells you to do that. °· Check your incision area every day for signs of infection. Check for: °? More redness, swelling, or pain. °? More fluid or blood. °? Warmth. °? Pus or a bad smell. °· Wear loose, soft clothing while your incisions heal. °Driving °· Do not drive or use heavy machinery while taking prescription pain medicine. °· Do not drive for 24 hours if you were given a medicine to help you relax (sedative) during your procedure. °Activity °· Do not lift anything that is heavier than 10 lb (4.5 kg), or the limit that you are told, until your health care provider says that it is safe. °· Ask your health care provider what activities are safe for you. A lot of activity during the first week after surgery can increase pain and swelling. For 1 week after your  procedure: °? Avoid activities that take a lot of effort, such as exercise or sports. °? You may walk and climb stairs as needed for daily activity, but avoid long walks or climbing stairs for exercise. °Managing pain and swelling ° °· Put ice on painful or swollen areas: °? Put ice in a plastic bag. °? Place a towel between your skin and the bag. °? Leave the ice on for 20 minutes, 2-3 times a day. °General instructions °· Do not take baths, swim, or use a hot tub until your health care provider approves. Ask your health care provider if you may take showers. You may only be allowed to take sponge baths. °· Take over-the-counter and prescription medicines only as told by your health care provider. °· To prevent or treat constipation while you are taking prescription pain medicine, your health care provider may recommend that you: °? Drink enough fluid to keep your urine pale yellow. °? Take over-the-counter or prescription medicines. °? Eat foods that are high in fiber, such as fresh fruits and vegetables, whole grains, and beans. °? Limit foods that are high in fat and processed sugars, such as fried and sweet foods. °· Do not use any products that contain nicotine or tobacco, such as cigarettes and e-cigarettes. If you need help quitting, ask your health care provider. °· Drink enough fluid to keep your urine pale yellow. °· Keep all follow-up visits as told by your health care provider. This is important. °Contact a health care provider if: °· You   have more redness, swelling, or pain around your incisions or your groin area. °· You have more swelling in your scrotum. °· You have more fluid or blood coming from your incisions. °· Your incisions feel warm to the touch. °· You have severe pain and medicines do not help. °· You have abdominal pain or swelling. °· You cannot eat or drink without vomiting. °· You cannot urinate or pass a bowel movement. °· You faint. °· You feel dizzy. °· You have nausea and  vomiting. °· You have a fever. °Get help right away if: °· You have pus or a bad smell coming from your incisions. °· You have chest pain. °· You have problems breathing. °Summary °· Pain, swelling, and bruising are common after the procedure. °· Check your incision area every day for signs of infection, such as more redness, swelling, or pain. °· Put ice on painful or swollen areas for 20 minutes, 2-3 times a day. °This information is not intended to replace advice given to you by your health care provider. Make sure you discuss any questions you have with your health care provider. °AMBULATORY SURGERY  °DISCHARGE INSTRUCTIONS ° ° °1) The drugs that you were given will stay in your system until tomorrow so for the next 24 hours you should not: ° °A) Drive an automobile °B) Make any legal decisions °C) Drink any alcoholic beverage ° ° °2) You may resume regular meals tomorrow.  Today it is better to start with liquids and gradually work up to solid foods. ° °You may eat anything you prefer, but it is better to start with liquids, then soup and crackers, and gradually work up to solid foods. ° ° °3) Please notify your doctor immediately if you have any unusual bleeding, trouble breathing, redness and pain at the surgery site, drainage, fever, or pain not relieved by medication. ° ° ° °4) Additional Instructions: ° ° ° ° ° ° ° °Please contact your physician with any problems or Same Day Surgery at [phone], Monday through Friday 6 am to 4 pm, or [place] [facility] [place] Main number at [phone]. °Document Released: 01/23/2017 Document Revised: 10/17/2017 Document Reviewed: 01/23/2017 °Elsevier Patient Education © 2020 [employer] ° °

## 2019-07-28 LAB — SURGICAL PATHOLOGY

## 2019-07-29 ENCOUNTER — Encounter: Payer: Self-pay | Admitting: Surgery

## 2019-07-30 ENCOUNTER — Inpatient Hospital Stay (HOSPITAL_COMMUNITY): Admission: AD | Admit: 2019-07-30 | Payer: 59 | Source: Other Acute Inpatient Hospital | Admitting: Cardiology

## 2019-08-01 DIAGNOSIS — I214 Non-ST elevation (NSTEMI) myocardial infarction: Secondary | ICD-10-CM | POA: Insufficient documentation

## 2019-08-01 HISTORY — DX: Non-ST elevation (NSTEMI) myocardial infarction: I21.4

## 2019-08-03 ENCOUNTER — Encounter: Payer: Self-pay | Admitting: Physician Assistant

## 2019-08-03 ENCOUNTER — Other Ambulatory Visit: Payer: Self-pay

## 2019-08-03 ENCOUNTER — Ambulatory Visit (INDEPENDENT_AMBULATORY_CARE_PROVIDER_SITE_OTHER): Payer: 59 | Admitting: Physician Assistant

## 2019-08-03 VITALS — BP 101/73 | HR 114 | Temp 97.2°F | Ht 71.0 in | Wt 230.2 lb

## 2019-08-03 DIAGNOSIS — K4090 Unilateral inguinal hernia, without obstruction or gangrene, not specified as recurrent: Secondary | ICD-10-CM

## 2019-08-03 DIAGNOSIS — Z09 Encounter for follow-up examination after completed treatment for conditions other than malignant neoplasm: Secondary | ICD-10-CM

## 2019-08-03 DIAGNOSIS — K429 Umbilical hernia without obstruction or gangrene: Secondary | ICD-10-CM

## 2019-08-03 MED ORDER — MORPHINE SULFATE 4 MG/ML IJ SOLN
2.00 | INTRAMUSCULAR | Status: DC
Start: ? — End: 2019-08-03

## 2019-08-03 MED ORDER — LISINOPRIL 5 MG PO TABS
10.00 | ORAL_TABLET | ORAL | Status: DC
Start: 2019-08-03 — End: 2019-08-03

## 2019-08-03 MED ORDER — CLOPIDOGREL BISULFATE 75 MG PO TABS
75.00 | ORAL_TABLET | ORAL | Status: DC
Start: ? — End: 2019-08-03

## 2019-08-03 MED ORDER — DEXTROSE 10 % IV SOLN
125.00 | INTRAVENOUS | Status: DC
Start: ? — End: 2019-08-03

## 2019-08-03 MED ORDER — NORTRIPTYLINE HCL 25 MG PO CAPS
50.00 | ORAL_CAPSULE | ORAL | Status: DC
Start: 2019-08-02 — End: 2019-08-03

## 2019-08-03 MED ORDER — ATORVASTATIN CALCIUM 40 MG PO TABS
80.00 | ORAL_TABLET | ORAL | Status: DC
Start: 2019-08-02 — End: 2019-08-03

## 2019-08-03 MED ORDER — ACETAMINOPHEN 325 MG PO TABS
650.00 | ORAL_TABLET | ORAL | Status: DC
Start: ? — End: 2019-08-03

## 2019-08-03 MED ORDER — ONDANSETRON HCL 4 MG/2ML IJ SOLN
4.00 | INTRAMUSCULAR | Status: DC
Start: ? — End: 2019-08-03

## 2019-08-03 MED ORDER — ASPIRIN 81 MG PO CHEW
81.00 | CHEWABLE_TABLET | ORAL | Status: DC
Start: 2019-08-03 — End: 2019-08-03

## 2019-08-03 MED ORDER — METOPROLOL TARTRATE 50 MG PO TABS
50.00 | ORAL_TABLET | ORAL | Status: DC
Start: 2019-08-02 — End: 2019-08-03

## 2019-08-03 NOTE — Progress Notes (Signed)
[facility] POST-OP OFFICE VISIT  08/03/2019  HPI: Maxwell Smith is a 53 y.o. male 7 days s/p robotic-assisted laparoscopic transabdominal right inguinal hernia repair with mesh and umbilical hernia repair with Dr Dahlia Byes.   Of note, He was admitted to [facility] on 10/02-10/05 secondary to NSTEMI. Troponin peaked at 3.03. EKG without ST changes. He underwent heart catheterization on 10/04. This revealed a 70% mid LAD stenosis, 95% proximal left circumflex stenosis and 70% diffuse small vessel disease in the distal circumflex. 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 Left circumflex artery felt to be the culprit. The patient was stented with a 3.0 x 16 mm Synergy DES to the left circumflex artery with good results, He was started on Plavix and discharged home.   Today, he reports from a surgical standpoint he is doing well. He has intermittent "gas" pain in his right groin. No fever, chills, nausea, or emesis. No trouble with PO intake. Having BMs. He does note fatigue attributable to recent NSTEMI. Does endorse right sided scrotal swelling. No other acute issues.   Vital signs: BP 101/73   Pulse (!) 114   Temp (!) 97.2 F (36.2 C) (Temporal)   Ht 5\' 11"  (1.803 m)   Wt 230 lb 3.2 oz (104.4 kg)   SpO2 97%   BMI 32.11 kg/m    Physical Exam: Constitutional: Appears very fatigued, NAD Abdomen: Soft, non-tender, non-distended, no rebound/guarding Skin: Laparoscopic incision are CDI without erythema, there is no ecchymosis secondary to recent Plavix.   Assessment/Plan: This is a 53 y.o. male 7 days s/p robotic-assisted laparoscopic transabdominal right inguinal hernia repair with mesh and umbilical hernia repair   - pain control prn; tylenol if tolerated  - okay to shower, do not submerge wounds x1 week  - complete lifting restrictions  - reviewed post-op care  - I offered follow up appointment however at this point cardiology follow  up is primary concern. Reviewed concerning signs/symptoms that should prompt follow up which he understands. I told him not to hesitate to call with questions or concerns.   -- Edison Simon, PA-C [facility] 08/03/2019, 9:27 AM [phone] M-F: 7am - 4pm

## 2019-08-03 NOTE — Patient Instructions (Signed)
Follow-up with our office as needed.  Please call and ask to speak with a nurse if you develop questions or concerns.  Tylenol for pain if needed.  Continue lifting restrictions.

## 2019-09-14 ENCOUNTER — Other Ambulatory Visit: Payer: Self-pay

## 2019-09-14 ENCOUNTER — Encounter: Payer: Self-pay | Admitting: Psychology

## 2019-09-14 ENCOUNTER — Ambulatory Visit (INDEPENDENT_AMBULATORY_CARE_PROVIDER_SITE_OTHER): Payer: 59 | Admitting: Psychology

## 2019-09-14 ENCOUNTER — Ambulatory Visit: Payer: 59 | Admitting: Psychology

## 2019-09-14 DIAGNOSIS — F320 Major depressive disorder, single episode, mild: Secondary | ICD-10-CM

## 2019-09-14 DIAGNOSIS — F0781 Postconcussional syndrome: Secondary | ICD-10-CM | POA: Diagnosis not present

## 2019-09-14 DIAGNOSIS — S060X9S Concussion with loss of consciousness of unspecified duration, sequela: Secondary | ICD-10-CM | POA: Diagnosis not present

## 2019-09-14 DIAGNOSIS — K5792 Diverticulitis of intestine, part unspecified, without perforation or abscess without bleeding: Secondary | ICD-10-CM | POA: Insufficient documentation

## 2019-09-14 DIAGNOSIS — S060X9A Concussion with loss of consciousness of unspecified duration, initial encounter: Secondary | ICD-10-CM | POA: Insufficient documentation

## 2019-09-14 DIAGNOSIS — G4733 Obstructive sleep apnea (adult) (pediatric): Secondary | ICD-10-CM | POA: Insufficient documentation

## 2019-09-14 DIAGNOSIS — S060XAA Concussion with loss of consciousness status unknown, initial encounter: Secondary | ICD-10-CM | POA: Insufficient documentation

## 2019-09-14 DIAGNOSIS — R413 Other amnesia: Secondary | ICD-10-CM

## 2019-09-14 DIAGNOSIS — E781 Pure hyperglyceridemia: Secondary | ICD-10-CM | POA: Insufficient documentation

## 2019-09-14 NOTE — Progress Notes (Signed)
   Neuropsychology Note   SWAYZE PRIES completed 125 minutes of neuropsychological testing with technician, Milana Kidney, B.S., under the supervision of Dr. Christia Reading, Ph.D., licensed neuropsychologist. The patient did not appear overtly distressed by the testing session, per behavioral observation or via self-report to the technician. Rest breaks were offered.    In considering the patient's current level of functioning, level of presumed impairment, nature of symptoms, emotional and behavioral responses during the interview, level of literacy, and observed level of motivation/effort, a battery of tests was selected and communicated to the psychometrician.   Communication between the psychologist and technician was ongoing throughout the testing session and changes were made as deemed necessary based on patient performance on testing, technician observations and additional pertinent factors such as those listed above.   MONTE ZINNI will return within approximately two weeks for an interactive feedback session with Dr. Melvyn Novas at which time his test performances, clinical impressions, and treatment recommendations will be reviewed in detail. The patient understands he can contact our office should he require our assistance before this time.   Full report to follow.  125 minutes were spent face-to-face with patient administering standardized tests and 15 minutes were spent scoring (technician). [CPT [id number], [id number]]

## 2019-09-14 NOTE — Progress Notes (Signed)
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 Coding/Compliance:   Maxwell Smith. Couzens was provided with a verbal description of the nature and purpose of the present neuropsychological evaluation. Also reviewed were the foreseeable risks and/or discomforts and benefits of the procedure, limits of confidentiality, and mandatory reporting requirements of this provider. The patient was given the opportunity to ask questions and receive answers about the evaluation. Oral consent to participate was provided by the patient.   This evaluation was conducted by Newman Nickels, Ph.D., licensed clinical neuropsychologist. Maxwell Smith. Buffa completed a 30-minute clinical interview, billed as one unit [phone], and 140 minutes of cognitive testing, billed as one unit [phone] and four additional units [phone]. Psychometrist Wallace Keller, B.S., assisted Dr. Milbert Coulter with test administration and scoring procedures. As a separate and discrete service, Dr. Milbert Coulter spent a total of 180 minutes in interpretation and report writing, billed as one unit [id number] and two units [id number].

## 2019-09-15 ENCOUNTER — Encounter: Payer: Self-pay | Admitting: Psychology

## 2019-09-21 ENCOUNTER — Ambulatory Visit (INDEPENDENT_AMBULATORY_CARE_PROVIDER_SITE_OTHER): Payer: 59 | Admitting: Psychology

## 2019-09-21 ENCOUNTER — Other Ambulatory Visit: Payer: Self-pay

## 2019-09-21 ENCOUNTER — Encounter: Payer: Self-pay | Admitting: Psychology

## 2019-09-21 ENCOUNTER — Other Ambulatory Visit: Payer: Self-pay | Admitting: Neurology

## 2019-09-21 DIAGNOSIS — S060X9S Concussion with loss of consciousness of unspecified duration, sequela: Secondary | ICD-10-CM
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
## 2019-09-21 NOTE — Telephone Encounter (Signed)
Change in pharmacy location.

## 2019-09-21 NOTE — Telephone Encounter (Signed)
Requested Prescriptions   Pending Prescriptions Disp Refills  . nortriptyline (PAMELOR) 50 MG capsule [Pharmacy Med Name: NORTRIPTYLINE HCL 50 MG CAP] 30 capsule 29    Sig: TAKE 1 CAPSULE BY MOUTH EVERY DAY   Rx last filled: 05/17/19 #90 3 refills  Pt last seen: 06/28/19   Follow up appt scheduled:11/30/2019
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
## 2019-11-30 ENCOUNTER — Encounter: Payer: Self-pay | Admitting: Neurology

## 2019-11-30 ENCOUNTER — Ambulatory Visit (INDEPENDENT_AMBULATORY_CARE_PROVIDER_SITE_OTHER): Payer: 59 | Admitting: Neurology

## 2019-11-30 ENCOUNTER — Other Ambulatory Visit: Payer: Self-pay

## 2019-11-30 VITALS — BP 128/82 | HR 75 | Ht 71.0 in | Wt 219.0 lb

## 2019-11-30 DIAGNOSIS — G44309 Post-traumatic headache, unspecified, not intractable: Secondary | ICD-10-CM

## 2019-11-30 NOTE — Patient Instructions (Signed)
Follow up as needed
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
